# Patient Record
Sex: Female | Born: 1935 | Race: White | Hispanic: No | Marital: Married | Smoking: Never smoker
Health system: Southern US, Community
[De-identification: ages and names within clinical notes are randomized; demographics above are authoritative.]

## PROBLEM LIST (undated history)

## (undated) DIAGNOSIS — Z789 Other specified health status: Secondary | ICD-10-CM

## (undated) HISTORY — PX: BREAST CYST EXCISION: SHX579

---

## 2003-04-19 ENCOUNTER — Ambulatory Visit (HOSPITAL_COMMUNITY): Admission: RE | Admit: 2003-04-19 | Discharge: 2003-04-19 | Payer: Self-pay | Admitting: Internal Medicine

## 2003-04-19 ENCOUNTER — Encounter: Payer: Self-pay | Admitting: Internal Medicine

## 2003-08-14 ENCOUNTER — Ambulatory Visit (HOSPITAL_COMMUNITY): Admission: RE | Admit: 2003-08-14 | Discharge: 2003-08-14 | Payer: Self-pay | Admitting: Internal Medicine

## 2004-07-22 ENCOUNTER — Ambulatory Visit (HOSPITAL_COMMUNITY): Admission: RE | Admit: 2004-07-22 | Discharge: 2004-07-22 | Payer: Self-pay | Admitting: Internal Medicine

## 2005-04-17 ENCOUNTER — Ambulatory Visit (HOSPITAL_COMMUNITY): Admission: RE | Admit: 2005-04-17 | Discharge: 2005-04-17 | Payer: Self-pay | Admitting: Internal Medicine

## 2006-05-05 ENCOUNTER — Ambulatory Visit (HOSPITAL_COMMUNITY): Admission: RE | Admit: 2006-05-05 | Discharge: 2006-05-05 | Payer: Self-pay | Admitting: Internal Medicine

## 2006-08-17 ENCOUNTER — Ambulatory Visit (HOSPITAL_COMMUNITY): Admission: RE | Admit: 2006-08-17 | Discharge: 2006-08-17 | Payer: Self-pay | Admitting: Internal Medicine

## 2007-04-13 ENCOUNTER — Ambulatory Visit (HOSPITAL_COMMUNITY): Admission: RE | Admit: 2007-04-13 | Discharge: 2007-04-13 | Payer: Self-pay | Admitting: Internal Medicine

## 2009-01-02 ENCOUNTER — Ambulatory Visit (HOSPITAL_COMMUNITY): Admission: RE | Admit: 2009-01-02 | Discharge: 2009-01-02 | Payer: Self-pay | Admitting: Internal Medicine

## 2010-01-15 ENCOUNTER — Ambulatory Visit (HOSPITAL_COMMUNITY): Admission: RE | Admit: 2010-01-15 | Discharge: 2010-01-15 | Payer: Self-pay | Admitting: Internal Medicine

## 2010-01-18 ENCOUNTER — Ambulatory Visit (HOSPITAL_COMMUNITY): Admission: RE | Admit: 2010-01-18 | Discharge: 2010-01-18 | Payer: Self-pay | Admitting: Internal Medicine

## 2011-06-11 ENCOUNTER — Ambulatory Visit (HOSPITAL_COMMUNITY)
Admission: RE | Admit: 2011-06-11 | Discharge: 2011-06-11 | Disposition: A | Discharge status: Home / Self Care | Payer: Medicare HMO | Source: Ambulatory Visit | Attending: Internal Medicine | Admitting: Internal Medicine

## 2011-06-11 ENCOUNTER — Other Ambulatory Visit (HOSPITAL_COMMUNITY): Payer: Self-pay | Admitting: Internal Medicine

## 2011-06-11 DIAGNOSIS — R05 Cough: Secondary | ICD-10-CM

## 2011-06-11 DIAGNOSIS — R059 Cough, unspecified: Secondary | ICD-10-CM | POA: Insufficient documentation

## 2011-07-01 ENCOUNTER — Ambulatory Visit (HOSPITAL_COMMUNITY)
Admission: RE | Admit: 2011-07-01 | Discharge: 2011-07-01 | Disposition: A | Discharge status: Home / Self Care | Payer: Medicare HMO | Source: Ambulatory Visit | Attending: Internal Medicine | Admitting: Internal Medicine

## 2011-07-01 ENCOUNTER — Other Ambulatory Visit (HOSPITAL_COMMUNITY): Payer: Self-pay | Admitting: Internal Medicine

## 2011-07-01 DIAGNOSIS — R05 Cough: Secondary | ICD-10-CM

## 2011-07-01 DIAGNOSIS — R079 Chest pain, unspecified: Secondary | ICD-10-CM | POA: Insufficient documentation

## 2011-07-01 DIAGNOSIS — R059 Cough, unspecified: Secondary | ICD-10-CM

## 2014-04-18 ENCOUNTER — Encounter (HOSPITAL_COMMUNITY): Payer: Self-pay | Admitting: Pharmacy Technician

## 2014-04-26 ENCOUNTER — Encounter (HOSPITAL_COMMUNITY)
Admission: RE | Admit: 2014-04-26 | Discharge: 2014-04-26 | Disposition: A | Discharge status: Home / Self Care | Payer: Medicare HMO | Source: Ambulatory Visit | Attending: Ophthalmology | Admitting: Ophthalmology

## 2014-04-26 ENCOUNTER — Encounter (HOSPITAL_COMMUNITY): Payer: Self-pay

## 2014-04-26 DIAGNOSIS — Z01812 Encounter for preprocedural laboratory examination: Secondary | ICD-10-CM | POA: Diagnosis present

## 2014-04-26 DIAGNOSIS — H2510 Age-related nuclear cataract, unspecified eye: Secondary | ICD-10-CM | POA: Diagnosis not present

## 2014-04-26 DIAGNOSIS — Z01810 Encounter for preprocedural cardiovascular examination: Secondary | ICD-10-CM | POA: Diagnosis present

## 2014-04-26 LAB — BASIC METABOLIC PANEL
Anion gap: 11 (ref 5–15)
BUN: 14 mg/dL (ref 6–23)
CO2: 27 mEq/L (ref 19–32)
Calcium: 9.6 mg/dL (ref 8.4–10.5)
Chloride: 106 mEq/L (ref 96–112)
Creatinine, Ser: 0.87 mg/dL (ref 0.50–1.10)
GFR calc Af Amer: 70 mL/min — ABNORMAL LOW (ref >90)
GFR calc non Af Amer: 60 mL/min — ABNORMAL LOW (ref >90)
Glucose, Bld: 91 mg/dL (ref 70–99)
Potassium: 5 mEq/L (ref 3.7–5.3)
Sodium: 144 mEq/L (ref 137–147)

## 2014-04-26 LAB — HEMOGLOBIN AND HEMATOCRIT, BLOOD
HCT: 43.2 % (ref 36.0–46.0)
Hemoglobin: 14.3 g/dL (ref 12.0–15.0)

## 2014-04-26 NOTE — Pre-Procedure Instructions (Signed)
Patient given information to sign up for my chart at home. 

## 2014-04-26 NOTE — Patient Instructions (Signed)
Your procedure is scheduled on: 05/02/2014  Report to [facility] at  41  AM.  Call this number if you have problems the morning of surgery: [phone]   Do not eat food or drink liquids :After Midnight.      Take these medicines the morning of surgery with A SIP OF WATER: none   Do not wear jewelry, make-up or nail polish.  Do not wear lotions, powders, or perfumes.   Do not shave 48 hours prior to surgery.  Do not bring valuables to the hospital.  Contacts, dentures or bridgework may not be worn into surgery.  Leave suitcase in the car. After surgery it may be brought to your room.  For patients admitted to the hospital, checkout time is 11:00 AM the day of discharge.   Patients discharged the day of surgery will not be allowed to drive home.  :     Please read over the following fact sheets that you were given: Coughing and Deep Breathing, Surgical Site Infection Prevention, Anesthesia Post-op Instructions and Care and Recovery After Surgery    Cataract A cataract is a clouding of the lens of the eye. When a lens becomes cloudy, vision is reduced based on the degree and nature of the clouding. Many cataracts reduce vision to some degree. Some cataracts make people more near-sighted as they develop. Other cataracts increase glare. Cataracts that are ignored and become worse can sometimes look white. The white color can be seen through the pupil. CAUSES   Aging. However, cataracts may occur at any age, even in newborns.   Certain drugs.   Trauma to the eye.   Certain diseases such as diabetes.   Specific eye diseases such as chronic inflammation inside the eye or a sudden attack of a rare form of glaucoma.   Inherited or acquired medical problems.  SYMPTOMS   Gradual, progressive drop in vision in the affected eye.   Severe, rapid visual loss. This most often happens when trauma is the cause.  DIAGNOSIS  To detect a cataract, an eye doctor examines the lens. Cataracts are  best diagnosed with an exam of the eyes with the pupils enlarged (dilated) by drops.  TREATMENT  For an early cataract, vision may improve by using different eyeglasses or stronger lighting. If that does not help your vision, surgery is the only effective treatment. A cataract needs to be surgically removed when vision loss interferes with your everyday activities, such as driving, reading, or watching TV. A cataract may also have to be removed if it prevents examination or treatment of another eye problem. Surgery removes the cloudy lens and usually replaces it with a substitute lens (intraocular lens, IOL).  At a time when both you and your doctor agree, the cataract will be surgically removed. If you have cataracts in both eyes, only one is usually removed at a time. This allows the operated eye to heal and be out of danger from any possible problems after surgery (such as infection or poor wound healing). In rare cases, a cataract may be doing damage to your eye. In these cases, your caregiver may advise surgical removal right away. The vast majority of people who have cataract surgery have better vision afterward. HOME CARE INSTRUCTIONS  If you are not planning surgery, you may be asked to do the following:  Use different eyeglasses.   Use stronger or brighter lighting.   Ask your eye doctor about reducing your medicine dose or changing medicines if  it is thought that a medicine caused your cataract. Changing medicines does not make the cataract go away on its own.   Become familiar with your surroundings. Poor vision can lead to injury. Avoid bumping into things on the affected side. You are at a higher risk for tripping or falling.   Exercise extreme care when driving or operating machinery.   Wear sunglasses if you are sensitive to bright light or experiencing problems with glare.  SEEK IMMEDIATE MEDICAL CARE IF:   You have a worsening or sudden vision loss.   You notice redness,  swelling, or increasing pain in the eye.   You have a fever.  Document Released: 07/28/2005 Document Revised: 07/17/2011 Document Reviewed: 03/21/2011 [facility] Patient Information 2012 [place].PATIENT INSTRUCTIONS POST-ANESTHESIA  IMMEDIATELY FOLLOWING SURGERY:  Do not drive or operate machinery for the first twenty four hours after surgery.  Do not make any important decisions for twenty four hours after surgery or while taking narcotic pain medications or sedatives.  If you develop intractable nausea and vomiting or a severe headache please notify your doctor immediately.  FOLLOW-UP:  Please make an appointment with your surgeon as instructed. You do not need to follow up with anesthesia unless specifically instructed to do so.  WOUND CARE INSTRUCTIONS (if applicable):  Keep a dry clean dressing on the anesthesia/puncture wound site if there is drainage.  Once the wound has quit draining you may leave it open to air.  Generally you should leave the bandage intact for twenty four hours unless there is drainage.  If the epidural site drains for more than 36-48 hours please call the anesthesia department.  QUESTIONS?:  Please feel free to call your physician or the hospital operator if you have any questions, and they will be happy to assist you.

## 2014-05-01 MED ORDER — TETRACAINE HCL 0.5 % OP SOLN
OPHTHALMIC | Status: AC
  Filled 2014-05-01: qty 2

## 2014-05-01 MED ORDER — KETOROLAC TROMETHAMINE 0.5 % OP SOLN
OPHTHALMIC | Status: AC
  Filled 2014-05-01: qty 5

## 2014-05-01 MED ORDER — CYCLOPENTOLATE-PHENYLEPHRINE OP SOLN OPTIME - NO CHARGE
OPHTHALMIC | Status: AC
  Filled 2014-05-01: qty 2

## 2014-05-01 MED ORDER — PHENYLEPHRINE HCL 2.5 % OP SOLN
OPHTHALMIC | Status: AC
  Filled 2014-05-01: qty 15

## 2014-05-02 ENCOUNTER — Encounter (HOSPITAL_COMMUNITY)
Admission: RE | Disposition: A | Discharge status: Home / Self Care | Payer: Self-pay | Source: Ambulatory Visit | Attending: Ophthalmology

## 2014-05-02 ENCOUNTER — Encounter (HOSPITAL_COMMUNITY): Payer: Self-pay | Admitting: *Deleted

## 2014-05-02 ENCOUNTER — Ambulatory Visit (HOSPITAL_COMMUNITY)
Admission: RE | Admit: 2014-05-02 | Discharge: 2014-05-02 | Disposition: A | Discharge status: Home / Self Care | Payer: Medicare HMO | Source: Ambulatory Visit | Attending: Ophthalmology | Admitting: Ophthalmology

## 2014-05-02 ENCOUNTER — Ambulatory Visit (HOSPITAL_COMMUNITY): Payer: Medicare HMO | Admitting: Anesthesiology

## 2014-05-02 ENCOUNTER — Encounter (HOSPITAL_COMMUNITY): Payer: Medicare HMO | Admitting: Anesthesiology

## 2014-05-02 DIAGNOSIS — H2510 Age-related nuclear cataract, unspecified eye: Secondary | ICD-10-CM | POA: Insufficient documentation

## 2014-05-02 HISTORY — PX: CATARACT EXTRACTION W/PHACO: SHX586

## 2014-05-02 SURGERY — PHACOEMULSIFICATION, CATARACT, WITH IOL INSERTION
Anesthesia: Monitor Anesthesia Care | Site: Eye | Laterality: Right

## 2014-05-02 MED ORDER — BSS IO SOLN
INTRAOCULAR | Status: DC | PRN
  Administered 2014-05-02: 15 mL via INTRAOCULAR

## 2014-05-02 MED ORDER — PROVISC 10 MG/ML IO SOLN
INTRAOCULAR | Status: DC | PRN
  Administered 2014-05-02: 0.85 mL via INTRAOCULAR

## 2014-05-02 MED ORDER — KETOROLAC TROMETHAMINE 0.5 % OP SOLN
1.0000 [drp] | OPHTHALMIC | Status: AC
Start: 2014-05-02 — End: 2014-05-02
  Administered 2014-05-02 (×3): 1 [drp] via OPHTHALMIC

## 2014-05-02 MED ORDER — MIDAZOLAM HCL 2 MG/2ML IJ SOLN
INTRAMUSCULAR | Status: AC
  Filled 2014-05-02: qty 2

## 2014-05-02 MED ORDER — PHENYLEPHRINE HCL 2.5 % OP SOLN
1.0000 [drp] | OPHTHALMIC | Status: AC
  Administered 2014-05-02 (×3): 1 [drp] via OPHTHALMIC

## 2014-05-02 MED ORDER — MIDAZOLAM HCL 2 MG/2ML IJ SOLN
1.0000 mg | INTRAMUSCULAR | Status: DC | PRN
  Administered 2014-05-02: 2 mg via INTRAVENOUS

## 2014-05-02 MED ORDER — FENTANYL CITRATE 0.05 MG/ML IJ SOLN
INTRAMUSCULAR | Status: AC
  Filled 2014-05-02: qty 2

## 2014-05-02 MED ORDER — LACTATED RINGERS IV SOLN
INTRAVENOUS | Status: DC
  Administered 2014-05-02: 09:00:00 via INTRAVENOUS

## 2014-05-02 MED ORDER — TETRACAINE HCL 0.5 % OP SOLN
1.0000 [drp] | OPHTHALMIC | Status: AC
  Administered 2014-05-02 (×3): 1 [drp] via OPHTHALMIC

## 2014-05-02 MED ORDER — TETRACAINE 0.5 % OP SOLN OPTIME - NO CHARGE
OPHTHALMIC | Status: DC | PRN
  Administered 2014-05-02: 2 [drp] via OPHTHALMIC

## 2014-05-02 MED ORDER — BSS IO SOLN
INTRAOCULAR | Status: DC | PRN
  Administered 2014-05-02: 09:00:00

## 2014-05-02 MED ORDER — FENTANYL CITRATE 0.05 MG/ML IJ SOLN
25.0000 ug | INTRAMUSCULAR | Status: AC
  Administered 2014-05-02: 25 ug via INTRAVENOUS

## 2014-05-02 MED ORDER — EPINEPHRINE HCL 1 MG/ML IJ SOLN
INTRAMUSCULAR | Status: AC
  Filled 2014-05-02: qty 1

## 2014-05-02 MED ORDER — CYCLOPENTOLATE-PHENYLEPHRINE 0.2-1 % OP SOLN
1.0000 [drp] | OPHTHALMIC | Status: AC
  Administered 2014-05-02 (×3): 1 [drp] via OPHTHALMIC

## 2014-05-02 SURGICAL SUPPLY — 24 items
CAPSULAR TENSION RING-AMO (OPHTHALMIC RELATED) IMPLANT
CLOTH BEACON ORANGE TIMEOUT ST (SAFETY) ×2 IMPLANT
EYE SHIELD UNIVERSAL CLEAR (GAUZE/BANDAGES/DRESSINGS) ×2 IMPLANT
GLOVE BIO SURGEON STRL SZ 6.5 (GLOVE) ×2 IMPLANT
GLOVE ECLIPSE 6.5 STRL STRAW (GLOVE) IMPLANT
GLOVE ECLIPSE 7.0 STRL STRAW (GLOVE) IMPLANT
GLOVE EXAM NITRILE LRG STRL (GLOVE) IMPLANT
GLOVE EXAM NITRILE MD LF STRL (GLOVE) ×2 IMPLANT
GLOVE SKINSENSE NS SZ6.5 (GLOVE)
GLOVE SKINSENSE STRL SZ6.5 (GLOVE) IMPLANT
HEALON 5 0.6 ML (INTRAOCULAR LENS) IMPLANT
KIT VITRECTOMY (OPHTHALMIC RELATED) IMPLANT
PAD ARMBOARD 7.5X6 YLW CONV (MISCELLANEOUS) ×2 IMPLANT
PROC W NO LENS (INTRAOCULAR LENS)
PROC W SPEC LENS (INTRAOCULAR LENS)
PROCESS W NO LENS (INTRAOCULAR LENS) IMPLANT
PROCESS W SPEC LENS (INTRAOCULAR LENS) IMPLANT
RETRACTOR IRIS SIGHTPATH (OPHTHALMIC RELATED) IMPLANT
RING MALYGIN (MISCELLANEOUS) IMPLANT
SIGHTPATH CAT PROC W REG LENS (Ophthalmic Related) ×2 IMPLANT
TAPE SURG TRANSPORE 1 IN (GAUZE/BANDAGES/DRESSINGS) ×1 IMPLANT
TAPE SURGICAL TRANSPORE 1 IN (GAUZE/BANDAGES/DRESSINGS) ×1
VISCOELASTIC ADDITIONAL (OPHTHALMIC RELATED) IMPLANT
WATER STERILE IRR 250ML POUR (IV SOLUTION) ×2 IMPLANT

## 2014-05-02 NOTE — Discharge Instructions (Signed)
Christine Conner  05/02/2014           [facility] Instructions [place] [address]      1. Avoid closing eyes tightly. One often closes the eye tightly when laughing, talking, sneezing, coughing or if they feel irritated. At these times, you should be careful not to close your eyes tightly.  2. Instill eye drops as instructed. To instill drops in your eye, open it, look up and have someone gently pull the lower lid down and instill a couple of drops inside the lower lid.  3. Do not touch upper lid.  4. Take Advil or Tylenol for pain.  5. You may use either eye for near work, such as reading or sewing and you may watch television.  6. You may have your hair done at the beauty parlor at any time.  7. Wear dark glasses with or without your own glasses if you are in bright light.  8. Call our office at [phone] or [phone] if you have sharp pain in your eye or unusual symptoms.  9. Do not be concerned because vision in the operative eye is not good. It will not be good, no matter how successful the operation, until you get a special lens for it. Your old glasses will not be suited to the new eye that was operated on and you will not be ready for a new lens for about a month.  10. Follow up at the [facility] office.    I have received a copy of the above instructions and will follow them.

## 2014-05-02 NOTE — Anesthesia Preprocedure Evaluation (Signed)
Anesthesia Evaluation  Patient identified by MRN, date of birth, ID band Patient awake    Reviewed: Allergy & Precautions, H&P , NPO status , Patient's Chart, lab work & pertinent test results  Airway Mallampati: II TM Distance: <3 FB     Dental  (+) Teeth Intact   Pulmonary neg pulmonary ROS,  breath sounds clear to auscultation        Cardiovascular negative cardio ROS  Rhythm:Regular Rate:Normal     Neuro/Psych    GI/Hepatic negative GI ROS,   Endo/Other    Renal/GU      Musculoskeletal   Abdominal   Peds  Hematology   Anesthesia Other Findings   Reproductive/Obstetrics                           Anesthesia Physical Anesthesia Plan  ASA: I  Anesthesia Plan: MAC   Post-op Pain Management:    Induction: Intravenous  Airway Management Planned: Nasal Cannula  Additional Equipment:   Intra-op Plan:   Post-operative Plan:   Informed Consent: I have reviewed the patients History and Physical, chart, labs and discussed the procedure including the risks, benefits and alternatives for the proposed anesthesia with the patient or authorized representative who has indicated his/her understanding and acceptance.     Plan Discussed with:   Anesthesia Plan Comments:         Anesthesia Quick Evaluation

## 2014-05-02 NOTE — H&P (Signed)
The patient was re examined and there is no change in the patients condition since the original H and P. 

## 2014-05-02 NOTE — Anesthesia Postprocedure Evaluation (Signed)
  Anesthesia Post-op Note  Patient: Christine Conner  Procedure(s) Performed: Procedure(s) with comments: CATARACT EXTRACTION PHACO AND INTRAOCULAR LENS PLACEMENT (IOC) (Right) - CDE:8.79  Patient Location: Short Stay  Anesthesia Type:MAC  Level of Consciousness: awake, alert  and oriented  Airway and Oxygen Therapy: Patient Spontanous Breathing  Post-op Pain: none  Post-op Assessment: Post-op Vital signs reviewed, Patient's Cardiovascular Status Stable, Respiratory Function Stable, Patent Airway and No signs of Nausea or vomiting  Post-op Vital Signs: Reviewed and stable  Last Vitals:  Filed Vitals:   05/02/14 0835  BP: 128/81  Pulse:   Temp:   Resp: 22    Complications: No apparent anesthesia complications

## 2014-05-02 NOTE — Op Note (Signed)
Patient brought to the operating room and prepped and draped in the usual manner.  Lid speculum inserted in right eye.  Stab incision made at the twelve o'clock position.  Provisc instilled in the anterior chamber.   A 2.4 mm. Stab incision was made temporally.  An anterior capsulotomy was done with a bent 25 gauge needle.  The nucleus was hydrodissected.  The Phaco tip was inserted in the anterior chamber and the nucleus was emulsified.  CDE was 8.79.  The cortical material was then removed with the I and A tip.  Posterior capsule was the polished.  The anterior chamber was deepened with Provisc.  A 23.0 Diopter Rayner 570C IOL was then inserted in the capsular bag.  Provisc was then removed with the I and A tip.  The wound was then hydrated.  Patient sent to the Recovery Room in good condition with follow up in my office.  Preoperative Diagnosis:  Nuclear Cataract OD Postoperative Diagnosis:  Same Procedure name: Kelman Phacoemulsification OD with IOL

## 2014-05-02 NOTE — Transfer of Care (Signed)
Immediate Anesthesia Transfer of Care Note  Patient: Christine Conner  Procedure(s) Performed: Procedure(s) with comments: CATARACT EXTRACTION PHACO AND INTRAOCULAR LENS PLACEMENT (IOC) (Right) - CDE:8.79  Patient Location: Short Stay  Anesthesia Type:MAC  Level of Consciousness: awake  Airway & Oxygen Therapy: Patient Spontanous Breathing  Post-op Assessment: Report given to PACU RN  Post vital signs: Reviewed  Complications: No apparent anesthesia complications

## 2014-05-03 ENCOUNTER — Encounter (HOSPITAL_COMMUNITY): Payer: Self-pay | Admitting: Ophthalmology

## 2014-05-04 ENCOUNTER — Encounter (HOSPITAL_COMMUNITY): Payer: Self-pay | Admitting: Pharmacy Technician

## 2014-05-17 ENCOUNTER — Encounter (HOSPITAL_COMMUNITY)
Admission: RE | Admit: 2014-05-17 | Discharge: 2014-05-17 | Disposition: A | Discharge status: Home / Self Care | Payer: Medicare HMO | Source: Ambulatory Visit | Attending: Ophthalmology | Admitting: Ophthalmology

## 2014-05-22 MED ORDER — CYCLOPENTOLATE-PHENYLEPHRINE OP SOLN OPTIME - NO CHARGE
OPHTHALMIC | Status: AC
  Filled 2014-05-22: qty 2

## 2014-05-22 MED ORDER — ONDANSETRON HCL 4 MG/2ML IJ SOLN: 4.0000 mg | Freq: Once | INTRAMUSCULAR | Status: AC | PRN

## 2014-05-22 MED ORDER — KETOROLAC TROMETHAMINE 0.5 % OP SOLN
OPHTHALMIC | Status: AC
  Filled 2014-05-22: qty 5

## 2014-05-22 MED ORDER — PHENYLEPHRINE HCL 2.5 % OP SOLN
OPHTHALMIC | Status: AC
  Filled 2014-05-22: qty 15

## 2014-05-22 MED ORDER — TETRACAINE HCL 0.5 % OP SOLN
OPHTHALMIC | Status: AC
  Filled 2014-05-22: qty 2

## 2014-05-22 MED ORDER — FENTANYL CITRATE 0.05 MG/ML IJ SOLN: 25.0000 ug | INTRAMUSCULAR | Status: DC | PRN

## 2014-05-23 ENCOUNTER — Encounter (HOSPITAL_COMMUNITY): Payer: Medicare HMO | Admitting: Anesthesiology

## 2014-05-23 ENCOUNTER — Ambulatory Visit (HOSPITAL_COMMUNITY)
Admission: RE | Admit: 2014-05-23 | Discharge: 2014-05-23 | Disposition: A | Discharge status: Home / Self Care | Payer: Medicare HMO | Source: Ambulatory Visit | Attending: Ophthalmology | Admitting: Ophthalmology

## 2014-05-23 ENCOUNTER — Ambulatory Visit (HOSPITAL_COMMUNITY): Payer: Medicare HMO | Admitting: Anesthesiology

## 2014-05-23 ENCOUNTER — Encounter (HOSPITAL_COMMUNITY)
Admission: RE | Disposition: A | Discharge status: Home / Self Care | Payer: Self-pay | Source: Ambulatory Visit | Attending: Ophthalmology

## 2014-05-23 ENCOUNTER — Encounter (HOSPITAL_COMMUNITY): Payer: Self-pay | Admitting: *Deleted

## 2014-05-23 DIAGNOSIS — H2512 Age-related nuclear cataract, left eye: Secondary | ICD-10-CM | POA: Diagnosis present

## 2014-05-23 HISTORY — DX: Other specified health status: Z78.9

## 2014-05-23 HISTORY — PX: CATARACT EXTRACTION W/PHACO: SHX586

## 2014-05-23 SURGERY — PHACOEMULSIFICATION, CATARACT, WITH IOL INSERTION
Anesthesia: Monitor Anesthesia Care | Laterality: Left

## 2014-05-23 MED ORDER — CYCLOPENTOLATE-PHENYLEPHRINE 0.2-1 % OP SOLN
1.0000 [drp] | OPHTHALMIC | Status: AC
  Administered 2014-05-23 (×3): 1 [drp] via OPHTHALMIC

## 2014-05-23 MED ORDER — EPINEPHRINE HCL 1 MG/ML IJ SOLN
INTRAOCULAR | Status: DC | PRN
  Administered 2014-05-23: 10:00:00

## 2014-05-23 MED ORDER — PROVISC 10 MG/ML IO SOLN
INTRAOCULAR | Status: DC | PRN
  Administered 2014-05-23: 0.85 mL via INTRAOCULAR

## 2014-05-23 MED ORDER — LACTATED RINGERS IV SOLN
INTRAVENOUS | Status: DC
  Administered 2014-05-23: 09:00:00 via INTRAVENOUS

## 2014-05-23 MED ORDER — FENTANYL CITRATE 0.05 MG/ML IJ SOLN
25.0000 ug | INTRAMUSCULAR | Status: AC
  Administered 2014-05-23 (×2): 25 ug via INTRAVENOUS

## 2014-05-23 MED ORDER — EPINEPHRINE HCL 1 MG/ML IJ SOLN
INTRAMUSCULAR | Status: AC
  Filled 2014-05-23: qty 1

## 2014-05-23 MED ORDER — KETOROLAC TROMETHAMINE 0.5 % OP SOLN
1.0000 [drp] | OPHTHALMIC | Status: AC
  Administered 2014-05-23 (×3): 1 [drp] via OPHTHALMIC

## 2014-05-23 MED ORDER — MIDAZOLAM HCL 2 MG/2ML IJ SOLN
1.0000 mg | INTRAMUSCULAR | Status: DC | PRN
  Administered 2014-05-23: 2 mg via INTRAVENOUS
  Filled 2014-05-23: qty 2

## 2014-05-23 MED ORDER — MIDAZOLAM HCL 2 MG/2ML IJ SOLN
INTRAMUSCULAR | Status: AC
  Filled 2014-05-23: qty 2

## 2014-05-23 MED ORDER — BSS IO SOLN
INTRAOCULAR | Status: DC | PRN
  Administered 2014-05-23: 15 mL

## 2014-05-23 MED ORDER — FENTANYL CITRATE 0.05 MG/ML IJ SOLN
INTRAMUSCULAR | Status: AC
  Filled 2014-05-23: qty 2

## 2014-05-23 MED ORDER — TETRACAINE HCL 0.5 % OP SOLN
1.0000 [drp] | OPHTHALMIC | Status: AC
  Administered 2014-05-23 (×3): 1 [drp] via OPHTHALMIC

## 2014-05-23 MED ORDER — PHENYLEPHRINE HCL 2.5 % OP SOLN
1.0000 [drp] | OPHTHALMIC | Status: AC
  Administered 2014-05-23 (×3): 1 [drp] via OPHTHALMIC

## 2014-05-23 MED ORDER — POVIDONE-IODINE 5 % OP SOLN
OPHTHALMIC | Status: DC | PRN
  Administered 2014-05-23: 1 via OPHTHALMIC

## 2014-05-23 SURGICAL SUPPLY — 25 items
CAPSULAR TENSION RING-AMO (OPHTHALMIC RELATED) IMPLANT
CLOTH BEACON ORANGE TIMEOUT ST (SAFETY) ×2 IMPLANT
EYE SHIELD UNIVERSAL CLEAR (GAUZE/BANDAGES/DRESSINGS) ×2 IMPLANT
GLOVE BIO SURGEON STRL SZ 6.5 (GLOVE) IMPLANT
GLOVE ECLIPSE 6.5 STRL STRAW (GLOVE) IMPLANT
GLOVE ECLIPSE 7.0 STRL STRAW (GLOVE) IMPLANT
GLOVE EXAM NITRILE LRG STRL (GLOVE) IMPLANT
GLOVE EXAM NITRILE MD LF STRL (GLOVE) ×2 IMPLANT
GLOVE INDICATOR 7.0 STRL GRN (GLOVE) ×4 IMPLANT
GLOVE SKINSENSE NS SZ6.5 (GLOVE)
GLOVE SKINSENSE STRL SZ6.5 (GLOVE) IMPLANT
HEALON 5 0.6 ML (INTRAOCULAR LENS) IMPLANT
KIT VITRECTOMY (OPHTHALMIC RELATED) IMPLANT
PAD ARMBOARD 7.5X6 YLW CONV (MISCELLANEOUS) ×2 IMPLANT
PROC W NO LENS (INTRAOCULAR LENS)
PROC W SPEC LENS (INTRAOCULAR LENS)
PROCESS W NO LENS (INTRAOCULAR LENS) IMPLANT
PROCESS W SPEC LENS (INTRAOCULAR LENS) IMPLANT
RETRACTOR IRIS SIGHTPATH (OPHTHALMIC RELATED) IMPLANT
RING MALYGIN (MISCELLANEOUS) IMPLANT
SIGHTPATH CAT PROC W REG LENS (Ophthalmic Related) ×2 IMPLANT
TAPE SURG TRANSPORE 1 IN (GAUZE/BANDAGES/DRESSINGS) ×1 IMPLANT
TAPE SURGICAL TRANSPORE 1 IN (GAUZE/BANDAGES/DRESSINGS) ×1
VISCOELASTIC ADDITIONAL (OPHTHALMIC RELATED) IMPLANT
WATER STERILE IRR 250ML POUR (IV SOLUTION) ×2 IMPLANT

## 2014-05-23 NOTE — Anesthesia Preprocedure Evaluation (Signed)
Anesthesia Evaluation  Patient identified by MRN, date of birth, ID band Patient awake    Reviewed: Allergy & Precautions, H&P , NPO status , Patient's Chart, lab work & pertinent test results  Airway Mallampati: II TM Distance: <3 FB     Dental  (+) Teeth Intact   Pulmonary neg pulmonary ROS,  breath sounds clear to auscultation        Cardiovascular negative cardio ROS  Rhythm:Regular Rate:Normal     Neuro/Psych    GI/Hepatic negative GI ROS,   Endo/Other    Renal/GU      Musculoskeletal   Abdominal   Peds  Hematology   Anesthesia Other Findings   Reproductive/Obstetrics                           Anesthesia Physical Anesthesia Plan  ASA: I  Anesthesia Plan: MAC   Post-op Pain Management:    Induction: Intravenous  Airway Management Planned: Nasal Cannula  Additional Equipment:   Intra-op Plan:   Post-operative Plan:   Informed Consent: I have reviewed the patients History and Physical, chart, labs and discussed the procedure including the risks, benefits and alternatives for the proposed anesthesia with the patient or authorized representative who has indicated his/her understanding and acceptance.     Plan Discussed with:   Anesthesia Plan Comments:         Anesthesia Quick Evaluation

## 2014-05-23 NOTE — Transfer of Care (Signed)
Immediate Anesthesia Transfer of Care Note  Patient: Christine Conner  Procedure(s) Performed: Procedure(s) with comments: CATARACT EXTRACTION PHACO AND INTRAOCULAR LENS PLACEMENT (IOC) (Left) - CDE 9.01  Patient Location: PACU  Anesthesia Type:MAC  Level of Consciousness: awake, alert , oriented and patient cooperative  Airway & Oxygen Therapy: Patient Spontanous Breathing  Post-op Assessment: Report given to PACU RN, Post -op Vital signs reviewed and stable and Patient moving all extremities  Post vital signs: Reviewed and stable  Complications: No apparent anesthesia complications

## 2014-05-23 NOTE — Anesthesia Postprocedure Evaluation (Signed)
  Anesthesia Post-op Note  Patient: Christine Conner  Procedure(s) Performed: Procedure(s) with comments: CATARACT EXTRACTION PHACO AND INTRAOCULAR LENS PLACEMENT (IOC) (Left) - CDE 9.01  Patient Location: Short Stay  Anesthesia Type:MAC  Level of Consciousness: awake, alert , oriented and patient cooperative  Airway and Oxygen Therapy: Patient Spontanous Breathing  Post-op Pain: none  Post-op Assessment: Post-op Vital signs reviewed, Patient's Cardiovascular Status Stable, Respiratory Function Stable, Patent Airway and Pain level controlled  Post-op Vital Signs: Reviewed and stable  Last Vitals:  Filed Vitals:   05/23/14 0925  BP: 108/68  Pulse:   Temp:   Resp: 20    Complications: No apparent anesthesia complications

## 2014-05-23 NOTE — H&P (Signed)
The patient was re examined and there is no change in the patients condition since the original H and P. 

## 2014-05-23 NOTE — Discharge Instructions (Signed)
[name]  05/23/2014           [facility] Instructions [place] [address]      1. Avoid closing eyes tightly. One often closes the eye tightly when laughing, talking, sneezing, coughing or if they feel irritated. At these times, you should be careful not to close your eyes tightly.  2. Instill eye drops as instructed. To instill drops in your eye, open it, look up and have someone gently pull the lower lid down and instill a couple of drops inside the lower lid.  3. Do not touch upper lid.  4. Take Advil or Tylenol for pain.  5. You may use either eye for near work, such as reading or sewing and you may watch television.  6. You may have your hair done at the beauty parlor at any time.  7. Wear dark glasses with or without your own glasses if you are in bright light.  8. Call our office at [phone] or [phone] if you have sharp pain in your eye or unusual symptoms.  9. Do not be concerned because vision in the operative eye is not good. It will not be good, no matter how successful the operation, until you get a special lens for it. Your old glasses will not be suited to the new eye that was operated on and you will not be ready for a new lens for about a month.  10. Follow up at the [facility] office.    I have received a copy of the above instructions and will follow them.

## 2014-05-23 NOTE — Op Note (Signed)
Patient brought to the operating room and prepped and draped in the usual manner.  Lid speculum inserted in left eye.  Stab incision made at the twelve o'clock position.  Provisc instilled in the anterior chamber.   A 2.4 mm. Stab incision was made temporally.  An anterior capsulotomy was done with a bent 25 gauge needle.  The nucleus was hydrodissected.  The Phaco tip was inserted in the anterior chamber and the nucleus was emulsified.  CDE was 9.01.  The cortical material was then removed with the I and A tip.  Posterior capsule was the polished.  The anterior chamber was deepened with Provisc.  A 22.5 Diopter Rayner 570C IOL was then inserted in the capsular bag.  Provisc was then removed with the I and A tip.  The wound was then hydrated.  Patient sent to the Recovery Room in good condition with follow up in my office.  Preoperative Diagnosis:  Nuclear Cataract OS Postoperative Diagnosis:  Same Procedure name: Kelman Phacoemulsification OS with IOL

## 2014-05-24 ENCOUNTER — Encounter (HOSPITAL_COMMUNITY): Payer: Self-pay | Admitting: Ophthalmology

## 2015-01-04 ENCOUNTER — Ambulatory Visit (HOSPITAL_COMMUNITY)
Admission: RE | Admit: 2015-01-04 | Discharge: 2015-01-04 | Disposition: A | Discharge status: Home / Self Care | Payer: Medicare HMO | Source: Ambulatory Visit | Attending: Internal Medicine | Admitting: Internal Medicine

## 2015-01-04 ENCOUNTER — Other Ambulatory Visit (HOSPITAL_COMMUNITY): Payer: Self-pay | Admitting: Internal Medicine

## 2015-01-04 DIAGNOSIS — R0789 Other chest pain: Secondary | ICD-10-CM

## 2015-04-26 ENCOUNTER — Other Ambulatory Visit (HOSPITAL_COMMUNITY): Payer: Self-pay | Admitting: Internal Medicine

## 2015-04-26 DIAGNOSIS — R609 Edema, unspecified: Secondary | ICD-10-CM

## 2015-04-27 ENCOUNTER — Ambulatory Visit (HOSPITAL_COMMUNITY)
Admission: RE | Admit: 2015-04-27 | Discharge: 2015-04-27 | Disposition: A | Discharge status: Home / Self Care | Payer: Medicare HMO | Source: Ambulatory Visit | Attending: Internal Medicine | Admitting: Internal Medicine

## 2015-04-27 DIAGNOSIS — R609 Edema, unspecified: Secondary | ICD-10-CM

## 2015-04-27 DIAGNOSIS — M25552 Pain in left hip: Secondary | ICD-10-CM | POA: Diagnosis not present

## 2015-04-27 DIAGNOSIS — M7989 Other specified soft tissue disorders: Secondary | ICD-10-CM | POA: Diagnosis not present

## 2015-06-06 ENCOUNTER — Other Ambulatory Visit (HOSPITAL_COMMUNITY): Payer: Self-pay | Admitting: Internal Medicine

## 2015-06-06 ENCOUNTER — Ambulatory Visit (HOSPITAL_COMMUNITY)
Admission: RE | Admit: 2015-06-06 | Discharge: 2015-06-06 | Disposition: A | Discharge status: Home / Self Care | Payer: Medicare HMO | Source: Ambulatory Visit | Attending: Internal Medicine | Admitting: Internal Medicine

## 2015-06-06 DIAGNOSIS — M545 Low back pain: Secondary | ICD-10-CM | POA: Diagnosis present

## 2015-06-06 DIAGNOSIS — M1288 Other specific arthropathies, not elsewhere classified, other specified site: Secondary | ICD-10-CM | POA: Insufficient documentation

## 2015-06-06 DIAGNOSIS — M4316 Spondylolisthesis, lumbar region: Secondary | ICD-10-CM | POA: Insufficient documentation

## 2015-06-06 DIAGNOSIS — M5136 Other intervertebral disc degeneration, lumbar region: Secondary | ICD-10-CM | POA: Insufficient documentation

## 2015-07-20 DIAGNOSIS — E785 Hyperlipidemia, unspecified: Secondary | ICD-10-CM | POA: Diagnosis not present

## 2015-07-20 DIAGNOSIS — Z79899 Other long term (current) drug therapy: Secondary | ICD-10-CM | POA: Diagnosis not present

## 2015-07-20 DIAGNOSIS — M1990 Unspecified osteoarthritis, unspecified site: Secondary | ICD-10-CM | POA: Diagnosis not present

## 2015-07-20 DIAGNOSIS — R7301 Impaired fasting glucose: Secondary | ICD-10-CM | POA: Diagnosis not present

## 2015-07-27 DIAGNOSIS — Z0001 Encounter for general adult medical examination with abnormal findings: Secondary | ICD-10-CM | POA: Diagnosis not present

## 2015-07-27 DIAGNOSIS — M545 Low back pain: Secondary | ICD-10-CM | POA: Diagnosis not present

## 2015-07-27 DIAGNOSIS — Z23 Encounter for immunization: Secondary | ICD-10-CM | POA: Diagnosis not present

## 2015-07-27 DIAGNOSIS — M818 Other osteoporosis without current pathological fracture: Secondary | ICD-10-CM | POA: Diagnosis not present

## 2015-07-27 DIAGNOSIS — R7301 Impaired fasting glucose: Secondary | ICD-10-CM | POA: Diagnosis not present

## 2015-07-30 ENCOUNTER — Other Ambulatory Visit (HOSPITAL_COMMUNITY): Payer: Self-pay | Admitting: Internal Medicine

## 2015-07-30 DIAGNOSIS — Z780 Asymptomatic menopausal state: Secondary | ICD-10-CM

## 2015-08-14 ENCOUNTER — Ambulatory Visit (HOSPITAL_COMMUNITY)
Admission: RE | Admit: 2015-08-14 | Discharge: 2015-08-14 | Disposition: A | Discharge status: Home / Self Care | Payer: Medicare HMO | Source: Ambulatory Visit | Attending: Internal Medicine | Admitting: Internal Medicine

## 2015-08-14 DIAGNOSIS — M85851 Other specified disorders of bone density and structure, right thigh: Secondary | ICD-10-CM | POA: Insufficient documentation

## 2015-08-14 DIAGNOSIS — M8588 Other specified disorders of bone density and structure, other site: Secondary | ICD-10-CM | POA: Diagnosis not present

## 2015-08-14 DIAGNOSIS — C50919 Malignant neoplasm of unspecified site of unspecified female breast: Secondary | ICD-10-CM | POA: Diagnosis not present

## 2015-08-14 DIAGNOSIS — M8580 Other specified disorders of bone density and structure, unspecified site: Secondary | ICD-10-CM | POA: Diagnosis present

## 2015-08-14 DIAGNOSIS — R29890 Loss of height: Secondary | ICD-10-CM | POA: Insufficient documentation

## 2015-08-14 DIAGNOSIS — Z780 Asymptomatic menopausal state: Secondary | ICD-10-CM | POA: Diagnosis not present

## 2015-08-20 ENCOUNTER — Ambulatory Visit (HOSPITAL_COMMUNITY): Payer: Medicare HMO | Attending: Internal Medicine | Admitting: Physical Therapy

## 2015-08-20 DIAGNOSIS — M6281 Muscle weakness (generalized): Secondary | ICD-10-CM | POA: Diagnosis not present

## 2015-08-20 DIAGNOSIS — R293 Abnormal posture: Secondary | ICD-10-CM

## 2015-08-20 DIAGNOSIS — R262 Difficulty in walking, not elsewhere classified: Secondary | ICD-10-CM

## 2015-08-20 DIAGNOSIS — M545 Low back pain: Secondary | ICD-10-CM | POA: Insufficient documentation

## 2015-08-20 NOTE — Therapy (Signed)
[name], [place], [address] Phone: [phone]   Fax:  [phone]  Physical Therapy Evaluation  Patient Details  Name: Christine Conner MRN: [id number] Date of Birth: 08/29/1929 Referring Provider: Asencion Noble MD   Encounter Date: 08/20/2015      PT End of Session - 08/20/15 1701    Visit Number 1   Number of Visits 12   Date for PT Re-Evaluation 09/17/15   Authorization Type [employer] HMO    Authorization Time Period 08/20/15 to 10/18/15   Authorization - Visit Number 1   Authorization - Number of Visits 10   PT Start Time 1301   PT Stop Time 1344   PT Time Calculation (min) 43 min   Activity Tolerance Patient tolerated treatment well   Behavior During Therapy [facility] for tasks assessed/performed      Past Medical History  Diagnosis Date  . Medical history non-contributory     Past Surgical History  Procedure Laterality Date  . Breast cyst excision Right   . Cataract extraction w/phaco Right 05/02/2014    Procedure: CATARACT EXTRACTION PHACO AND INTRAOCULAR LENS PLACEMENT (IOC);  Surgeon: Elta Guadeloupe T. Gershon Crane, MD;  Location: [facility];  Service: Ophthalmology;  Laterality: Right;  CDE:8.79  . Cataract extraction w/phaco Left 05/23/2014    Procedure: CATARACT EXTRACTION PHACO AND INTRAOCULAR LENS PLACEMENT (IOC);  Surgeon: Elta Guadeloupe T. Gershon Crane, MD;  Location: [facility];  Service: Ophthalmology;  Laterality: Left;  CDE 9.01    There were no vitals filed for this visit.  Visit Diagnosis:  Low back pain, unspecified back pain laterality, with sciatica presence unspecified - Plan: PT plan of care cert/re-cert  Muscle weakness - Plan: PT plan of care cert/re-cert  Poor posture - Plan: PT plan of care cert/re-cert  Difficulty walking - Plan: PT plan of care cert/re-cert      Subjective Assessment - 08/20/15 1303    Subjective Pain throughout the day off and on; if she sits too long she will also have increased pain. She  reports that she was very active before all this pain started.    Pertinent History Patient reports she cannot remember when it started exactly, maybe 2-3 months ago, but that she has quite a bit of pain when getting out of bed and walking. Quite a bit of pain when she is extending up from a bent position. Pain throughtout the day.    How long can you sit comfortably? 1/9- 60 minutes    How long can you stand comfortably? 1/9- 30 minutes    How long can you walk comfortably? 1/9- does not hurt as much if she keeps moving    Patient Stated Goals be able to get up without pain    Currently in Pain? Yes   Pain Score 2    Pain Location Back   Pain Orientation Lower   Pain Descriptors / Indicators Sharp;Radiating  can go down either leg at times             [facility] PT Assessment - 08/20/15 0001    Assessment   Medical Diagnosis LBP   Referring Provider Asencion Noble MD    Onset Date/Surgical Date --  2-3 months ago    Next MD Visit no follow-up scheduled right now    Precautions   Precaution Comments anterolisthesis- caution with extension based tasks    Restrictions   Weight Bearing Restrictions No   Balance Screen   Has the patient  fallen in the past 6 months No   Has the patient had a decrease in activity level because of a fear of falling?  No   Is the patient reluctant to leave their home because of a fear of falling?  No   Prior Function   Level of Independence Independent;Independent with basic ADLs;Independent with gait;Independent with transfers   Vocation Retired   Leisure bible study, art   Observation/Other Assessments   Observations no significant LLD noted; patient reports no numbness or tingling    Posture/Postural Control   Posture Comments flexed at hips, forward head with B IR shoulders   AROM   Right Hip External Rotation  --  wfl    Right Hip Internal Rotation  17  wfl    Left Hip External Rotation  --  wfl    Left Hip Internal Rotation  21   Lumbar Flexion  84   Lumbar Extension 12   Lumbar - Right Side Bend fingertips past knee    Lumbar - Left Side Bend fingetrips past knee    Strength   Overall Strength Comments general core 2-3/5    Right Hip Flexion 3+/5   Right Hip Extension 3-/5   Right Hip ABduction 3+/5   Left Hip Flexion 3+/5   Left Hip Extension 3-/5   Left Hip ABduction 3/5   Right Knee Flexion 4/5   Right Knee Extension 4/5   Left Knee Flexion 4-/5   Left Knee Extension 4-/5   Right Ankle Dorsiflexion 4/5   Left Ankle Dorsiflexion 4/5   Ambulation/Gait   Gait Comments pronation bilaterally, flexed at hips, ;proximal muscle weakness    6 minute walk test results    Aerobic Endurance Distance Walked 972   Endurance additional comments 6MWT; gait speed 0.66m/s    High Level Balance   High Level Balance Comments TUG 10.4, 10.4, 10.08                           PT Education - 08/20/15 1701    Education provided Yes   Education Details prognosis, plan of care, HEP    Person(s) Educated Patient   Methods Explanation;Handout;Demonstration   Comprehension Verbalized understanding;Returned demonstration          PT Short Term Goals - 08/20/15 1706    PT SHORT TERM GOAL #1   Title Patient will be able to maintain correct posture at least 75% of the time during all functional siutations and scenarios in order to reduce pain and improve balance    Time 3   Period Weeks   Status New   PT SHORT TERM GOAL #2   Title Patient will experience no more than 4/10 pain with all functional tasks, transfers, and activities in order to enhance patient's ability to participate in tasks at home and in community    Time 3   Period Weeks   Status New   PT SHORT TERM GOAL #3   Title Patient will be able to ambulate at least 1259ft during 6 minute walk test in order to improve overall mobility and assist in return to community based tasks    Time 3   Period Weeks   Status New   PT SHORT TERM GOAL #4   Title Patient  to be independent in correctly and consistently performing appropriate HEP, to be updated PRN    Time 3   Period Weeks   Status New  PT Long Term Goals - September 15, 2015 1708    PT LONG TERM GOAL #1   Title Patient will demonstrate strength 4+/5 in all tested muscle groups in order to enhance regional stability and reduce pain    Time 6   Period Weeks   Status New   PT LONG TERM GOAL #2   Title Patient will experience no more than 2/10 during all functional tasks, transfers, and activities in order to enhance overall function and assist in return to community tasks    Time 6   Period Weeks   Status New   PT LONG TERM GOAL #3   Title Patient to report she has been able to return to community based activities on a regular basis in order to enhance overall function and quality of life    Time 6   Period Weeks   Status New   PT LONG TERM GOAL #4   Title Patient to have initiated participation in water based exercise program at least 2-3 times per week in order to assist in maintaining functional gains and managing back pain    Time 6   Period Weeks   Status New               Plan - Sep 15, 2015 1702    Clinical Impression Statement Patient presents with chronic back pain that appears to be related, at least in part, to anterolisthesis in her lumbar spine. Upon examination patient does demonstrate reduced gait speed, postural impairment, mild impairment on TUG, and significant postural deficits. Patient was educated regarding her anterolisthesis and relation to pain with extension based activities. At this time patient will benefit from skilled PT services in order to address functional impairments and assist in reaching an optimal level of  function.    Pt will benefit from skilled therapeutic intervention in order to improve on the following deficits Decreased activity tolerance;Decreased strength;Pain;Decreased mobility;Improper body mechanics;Impaired flexibility;Postural  dysfunction   Rehab Potential Good   Clinical Impairments Affecting Rehab Potential anterolisthesis   PT Frequency 2x / week   PT Duration 6 weeks   PT Treatment/Interventions ADLs/Self Care Home Management;Moist Heat;Gait training;Stair training;Functional mobility training;Therapeutic exercise;Therapeutic activities;Balance training;Neuromuscular re-education;Patient/family education   PT Next Visit Plan review HEP and goals; functional postural training and proximal strengthening, balance training. Caution with extension activities due to anterolisthesis    PT Home Exercise Plan given    Consulted and Agree with Plan of Care Patient          G-Codes - 09-15-2015 1716    Functional Assessment Tool Used Based on skilled clinical assessment of strength, gait, balance, pain, posture    Functional Limitation Mobility: Walking and moving around   Mobility: Walking and Moving Around Current Status [id number]) At least 40 percent but less than 60 percent impaired, limited or restricted   Mobility: Walking and Moving Around Goal Status [phone]) At least 20 percent but less than 40 percent impaired, limited or restricted       Problem List There are no active problems to display for this patient.   Deniece Ree PT, DPT [phone]  [place] [address] [place], [place], [address] Phone: [phone]   Fax:  [phone]  Name: CAMPBELLE BEKKER MRN: [id number] Date of Birth: November 26, 1929

## 2015-08-20 NOTE — Patient Instructions (Signed)
   HIP EXTENSION - STANDING  While standing, move your leg back as shown. Hold for 2 seconds and then release.  Use your arms for support if needed for balance and safety.  Repeat 10 times each side, 2-3 times per day.    HIP ABDUCTION - STANDING   While standing, raise your leg out to the side. Keep your knee straight and maintain your toes pointed forward the entire time. Hold for 2 seconds and then release.   Use your arms for support if needed for balance and safety.  Repeat 10 times each side, 2-3 times per day.   Knee Extension: Sit to Stand (Eccentric)    Stand close to chair. Slowly lower self to seated position. _5-10__ reps per set, _2__ sets per day, _5-7__ days per week. Progress to stopping midway before lowering to chair. Progress to barely touching chair.  Copyright  VHI. All rights reserved.     SCAPULAR RETRACTIONS  Draw your shoulder blades back and down. It should feel like you are trying to squeeze your shoulder blades together.   Repeat 10 times, 2-3 times per day.

## 2015-08-22 ENCOUNTER — Ambulatory Visit (HOSPITAL_COMMUNITY): Payer: Medicare HMO

## 2015-08-22 DIAGNOSIS — M545 Low back pain: Secondary | ICD-10-CM

## 2015-08-22 DIAGNOSIS — M6281 Muscle weakness (generalized): Secondary | ICD-10-CM

## 2015-08-22 DIAGNOSIS — R293 Abnormal posture: Secondary | ICD-10-CM

## 2015-08-22 DIAGNOSIS — R262 Difficulty in walking, not elsewhere classified: Secondary | ICD-10-CM

## 2015-08-22 NOTE — Therapy (Signed)
[place], [place], [address] Phone: [phone]   Fax:  [phone]  Physical Therapy Treatment  Patient Details  Name: Christine Conner MRN: [id number] Date of Birth: 1930/04/12 Referring Provider: Asencion Noble MD   Encounter Date: 08/22/2015      PT End of Session - 08/22/15 1359    Visit Number 2   Number of Visits 12   Date for PT Re-Evaluation 09/17/15   Authorization Type [employer] HMO    Authorization Time Period 08/20/15 to 10/18/15   Authorization - Visit Number 2   Authorization - Number of Visits 10   PT Start Time 1350   PT Stop Time 1428   PT Time Calculation (min) 38 min   Activity Tolerance Patient tolerated treatment well   Behavior During Therapy [facility] for tasks assessed/performed      Past Medical History  Diagnosis Date  . Medical history non-contributory     Past Surgical History  Procedure Laterality Date  . Breast cyst excision Right   . Cataract extraction w/phaco Right 05/02/2014    Procedure: CATARACT EXTRACTION PHACO AND INTRAOCULAR LENS PLACEMENT (IOC);  Surgeon: Elta Guadeloupe T. Gershon Crane, MD;  Location: [facility];  Service: Ophthalmology;  Laterality: Right;  CDE:8.79  . Cataract extraction w/phaco Left 05/23/2014    Procedure: CATARACT EXTRACTION PHACO AND INTRAOCULAR LENS PLACEMENT (IOC);  Surgeon: Elta Guadeloupe T. Gershon Crane, MD;  Location: [facility];  Service: Ophthalmology;  Laterality: Left;  CDE 9.01    There were no vitals filed for this visit.  Visit Diagnosis:  Low back pain, unspecified back pain laterality, with sciatica presence unspecified  Muscle weakness  Poor posture  Difficulty walking      Subjective Assessment - 08/22/15 1353    Subjective Pt reports lower back pain with radicukar symptoms on buttock, pain scale 5/10 tingling.  She reports more pain with standing, and relief in chair with LE elevated.   Pertinent History Patient reports she cannot remember when it started  exactly, maybe 2-3 months ago, but that she has quite a bit of pain when getting out of bed and walking. Quite a bit of pain when she is extending up from a bent position. Pain throughtout the day.    Patient Stated Goals be able to get up without pain    Currently in Pain? Yes   Pain Score 5    Pain Location Back   Pain Orientation Lower   Pain Descriptors / Indicators Tingling                         [facility] Adult PT Treatment/Exercise - 08/22/15 0001    Exercises   Exercises Lumbar   Lumbar Exercises: Stretches   Active Hamstring Stretch 3 reps;30 seconds   Active Hamstring Stretch Limitations supine with rope   Lumbar Exercises: Standing   Functional Squats 10 reps   Scapular Retraction 10 reps;Theraband   Theraband Level (Scapular Retraction) Level 2 (Red)   Row 10 reps;Theraband   Theraband Level (Row) Level 2 (Red)   Shoulder Extension Both;10 reps;Theraband   Theraband Level (Shoulder Extension) Level 2 (Red)   Lumbar Exercises: Seated   Sit to Stand 10 reps  cueing for technique, able to complete with no HHA   Lumbar Exercises: Supine   Bridge 10 reps   Straight Leg Raise 10 reps   Lumbar Exercises: Sidelying   Hip Abduction 10 reps  PT Short Term Goals - 08/20/15 1706    PT SHORT TERM GOAL #1   Title Patient will be able to maintain correct posture at least 75% of the time during all functional siutations and scenarios in order to reduce pain and improve balance    Time 3   Period Weeks   Status New   PT SHORT TERM GOAL #2   Title Patient will experience no more than 4/10 pain with all functional tasks, transfers, and activities in order to enhance patient's ability to participate in tasks at home and in community    Time 3   Period Weeks   Status New   PT SHORT TERM GOAL #3   Title Patient will be able to ambulate at least 1273ft during 6 minute walk test in order to improve overall mobility and assist in return to  community based tasks    Time 3   Period Weeks   Status New   PT SHORT TERM GOAL #4   Title Patient to be independent in correctly and consistently performing appropriate HEP, to be updated PRN    Time 3   Period Weeks   Status New           PT Long Term Goals - 08/20/15 1708    PT LONG TERM GOAL #1   Title Patient will demonstrate strength 4+/5 in all tested muscle groups in order to enhance regional stability and reduce pain    Time 6   Period Weeks   Status New   PT LONG TERM GOAL #2   Title Patient will experience no more than 2/10 during all functional tasks, transfers, and activities in order to enhance overall function and assist in return to community tasks    Time 6   Period Weeks   Status New   PT LONG TERM GOAL #3   Title Patient to report she has been able to return to community based activities on a regular basis in order to enhance overall function and quality of life    Time 6   Period Weeks   Status New   PT LONG TERM GOAL #4   Title Patient to have initiated participation in water based exercise program at least 2-3 times per week in order to assist in maintaining functional gains and managing back pain    Time 6   Period Weeks   Status New               Plan - 08/22/15 1428    Clinical Impression Statement Reviewed goals, compliance and technique with HEP and copy of evaluation given to pt.  Session focus on proximal musculature strengthening and awareness of improving posture.  Pt able to complete all exercises with good form following initial cueing for technqiue.  Pt c/o hamstring cramping during bridges, resolved with hamstring stretches.  Pt able to complete bridges and squats with good form with no reports of increased pain with extension activities, continue to be cautious due to anterolistesis.  End of session pt reports pain resolved with no symptoms.   PT Next Visit Plan Continue wiht functional postural training and proximal strengthening,  balance training. Caution with extension activities due to anterolisthesis         Problem List There are no active problems to display for this patient.  [address], LPTA; [place]  Aldona Lento 08/22/2015, 2:40 PM  [place] [address] [place], [place], [address] Phone:  [phone]   Fax:  [phone]  Name: Christine Conner MRN: [id number] Date of Birth: 01-18-1930

## 2015-08-28 ENCOUNTER — Ambulatory Visit (HOSPITAL_COMMUNITY): Payer: Medicare HMO

## 2015-08-28 DIAGNOSIS — R293 Abnormal posture: Secondary | ICD-10-CM

## 2015-08-28 DIAGNOSIS — M545 Low back pain: Secondary | ICD-10-CM | POA: Diagnosis not present

## 2015-08-28 DIAGNOSIS — M6281 Muscle weakness (generalized): Secondary | ICD-10-CM

## 2015-08-28 DIAGNOSIS — R262 Difficulty in walking, not elsewhere classified: Secondary | ICD-10-CM

## 2015-08-28 NOTE — Patient Instructions (Signed)
Double Knee to Chest (Flexion)   Gently pull both knees toward chest. Feel stretch in lower back or buttock area. Breathing deeply, Hold _30__ seconds. Repeat __3__ times. Do __3__ sessions per day.  Knee to Chest (Flexion)   Pull knee toward chest. Feel stretch in lower back or buttock area. Breathing deeply, Hold _30___ seconds. Repeat with other knee. Repeat __3__ times. Do __3__ sessions per day.   Pelvic Tilt: Posterior - Legs Bent (Supine)    Tighten stomach and flatten back by rolling pelvis down. Hold __5__ seconds. Relax. Repeat __10__ times per set. Do __2__ sets per session. Do __3__ sessions per day.  http://orth.exer.us/203   Copyright  VHI. All rights reserved.      Isometric Abdominal   Lying on back with knees bent, tighten stomach by pressing elbows down. Hold _5___ seconds. Repeat __10__ times per set. Do ___2_ sets per session. Do __3__ sessions per day.

## 2015-08-28 NOTE — Therapy (Addendum)
[place], [place], [address] Phone: [phone]   Fax:  [phone]  Physical Therapy Treatment  Patient Details  Name: Christine Conner MRN: [id number] Date of Birth: 1930/05/06 Referring Provider: Asencion Noble MD   Encounter Date: 08/28/2015      PT End of Session - 08/28/15 1349    Visit Number 3   Number of Visits 12   Date for PT Re-Evaluation 09/17/15   Authorization Type [employer] Medicare HMO    Authorization Time Period 08/20/15 to 10/18/15   Authorization - Visit Number 3   Authorization - Number of Visits 10   PT Start Time 0100   PT Stop Time 0145   PT Time Calculation (min) 45 min   Activity Tolerance Patient tolerated treatment well   Behavior During Therapy [facility] for tasks assessed/performed      Past Medical History  Diagnosis Date  . Medical history non-contributory     Past Surgical History  Procedure Laterality Date  . Breast cyst excision Right   . Cataract extraction w/phaco Right 05/02/2014    Procedure: CATARACT EXTRACTION PHACO AND INTRAOCULAR LENS PLACEMENT (IOC);  Surgeon: Elta Guadeloupe T. Gershon Crane, MD;  Location: [facility];  Service: Ophthalmology;  Laterality: Right;  CDE:8.79  . Cataract extraction w/phaco Left 05/23/2014    Procedure: CATARACT EXTRACTION PHACO AND INTRAOCULAR LENS PLACEMENT (IOC);  Surgeon: Elta Guadeloupe T. Gershon Crane, MD;  Location: [facility];  Service: Ophthalmology;  Laterality: Left;  CDE 9.01    There were no vitals filed for this visit.  Visit Diagnosis:  Low back pain, unspecified back pain laterality, with sciatica presence unspecified  Muscle weakness  Poor posture  Difficulty walking      Subjective Assessment - 08/28/15 1326    Subjective Pt denies pain or radicular symptoms currently. Pt reports compliance with HEP, but difficulty with sit to stand activity. Pt reports increased pain in AM with pain rating 4/10 this AM.    Currently in Pain? No/denies   Pain Location Back   Pain Orientation Lower   Pain Descriptors / Indicators Aching            [facility] PT Assessment - 08/28/15 0001    Observation/Other Assessments   Observations BP: 110/70 pre tx, 120/72 post tx.                      [facility] Adult PT Treatment/Exercise - 08/28/15 0001    Lumbar Exercises: Stretches   Single Knee to Chest Stretch 3 reps;30 seconds  HEP   Single Knee to Chest Stretch Limitations bilat   Double Knee to Chest Stretch 3 reps;30 seconds  HEP   Lower Trunk Rotation 3 reps;10 seconds   Pelvic Tilt Other (comment)  50% effort due to HS cramping.    Pelvic Tilt Limitations 5 sec x 10 reps   Lumbar Exercises: Seated   Sit to Stand 10 reps  VCs for core brace & breathing during sit to stand activity   Sit to Stand Limitations SEATED LUMBAR FLEXION STRETCH, 3, 30 sec holds.    Lumbar Exercises: Supine   Ab Set 10 reps;5 seconds                PT Education - 08/28/15 1348    Education provided Yes   Education Details Prescribed HEP: lumbar flexion ther ex and core strengthening. Instructed to discontinue any ther ex that increases pain.    Person(s) Educated Patient   Methods  Explanation;Demonstration;Verbal cues   Comprehension Verbalized understanding;Returned demonstration;Verbal cues required          PT Short Term Goals - 08/20/15 1706    PT SHORT TERM GOAL #1   Title Patient will be able to maintain correct posture at least 75% of the time during all functional siutations and scenarios in order to reduce pain and improve balance    Time 3   Period Weeks   Status New   PT SHORT TERM GOAL #2   Title Patient will experience no more than 4/10 pain with all functional tasks, transfers, and activities in order to enhance patient's ability to participate in tasks at home and in community    Time 3   Period Weeks   Status New   PT SHORT TERM GOAL #3   Title Patient will be able to ambulate at least 1294ft during 6 minute walk test in order to  improve overall mobility and assist in return to community based tasks    Time 3   Period Weeks   Status New   PT SHORT TERM GOAL #4   Title Patient to be independent in correctly and consistently performing appropriate HEP, to be updated PRN    Time 3   Period Weeks   Status New           PT Long Term Goals - 08/20/15 1708    PT LONG TERM GOAL #1   Title Patient will demonstrate strength 4+/5 in all tested muscle groups in order to enhance regional stability and reduce pain    Time 6   Period Weeks   Status New   PT LONG TERM GOAL #2   Title Patient will experience no more than 2/10 during all functional tasks, transfers, and activities in order to enhance overall function and assist in return to community tasks    Time 6   Period Weeks   Status New   PT LONG TERM GOAL #3   Title Patient to report she has been able to return to community based activities on a regular basis in order to enhance overall function and quality of life    Time 6   Period Weeks   Status New   PT LONG TERM GOAL #4   Title Patient to have initiated participation in water based exercise program at least 2-3 times per week in order to assist in maintaining functional gains and managing back pain    Time 6   Period Weeks   Status New               Plan - 08/28/15 1350    Clinical Impression Statement Progressed HEP to include gentle lumbar flexion-based ther ex with core strengthening. Pt demonstrated good understanding and mm recruitment during abdom brace. Pt had increased HS crampoing initially with PPT activity, but cramping abolished when instructed to perform PPT with 50% effort instead of 100% effort. Pt was given written HEP for SKTC, DKTC, PPT, and abdom brace activity. No increase in pain today with any activities and pt demonstrated understanding of HEP.  Addendum: BP: 110/70 pre tx, 120/72 post tx.   PT Next Visit Plan Review HEP. Core strengthening/ progression to core brace with  functional activities. Avoid extension-based movements.    Consulted and Agree with Plan of Care Patient        Problem List There are no active problems to display for this patient.   Dollene Cleveland, PT 08/28/2015, 2:25 PM    [facility] [address] [place], [place], [address] Phone: [phone]   Fax:  [phone]  Name: Christine Conner MRN: [id number] Date of Birth: Nov 03, 1929

## 2015-08-30 ENCOUNTER — Ambulatory Visit (HOSPITAL_COMMUNITY): Payer: Medicare HMO

## 2015-08-30 DIAGNOSIS — R293 Abnormal posture: Secondary | ICD-10-CM

## 2015-08-30 DIAGNOSIS — R262 Difficulty in walking, not elsewhere classified: Secondary | ICD-10-CM

## 2015-08-30 DIAGNOSIS — M545 Low back pain: Secondary | ICD-10-CM

## 2015-08-30 DIAGNOSIS — M6281 Muscle weakness (generalized): Secondary | ICD-10-CM

## 2015-08-30 NOTE — Therapy (Signed)
[place], [place], [address] Phone: [phone]   Fax:  [phone]  Physical Therapy Treatment  Patient Details  Name: Christine Conner MRN: [id number] Date of Birth: 05-Jan-1930 Referring Provider: Asencion Noble MD   Encounter Date: 08/30/2015      PT End of Session - 08/30/15 1401    Visit Number 4   Number of Visits 12   Date for PT Re-Evaluation 09/17/15   Authorization Type [employer] Medicare HMO    Authorization Time Period 08/20/15 to 10/18/15   Authorization - Visit Number 4   Authorization - Number of Visits 10   PT Start Time 0100   PT Stop Time 0145   PT Time Calculation (min) 45 min   Activity Tolerance Patient tolerated treatment well   Behavior During Therapy [facility] for tasks assessed/performed      Past Medical History  Diagnosis Date  . Medical history non-contributory     Past Surgical History  Procedure Laterality Date  . Breast cyst excision Right   . Cataract extraction w/phaco Right 05/02/2014    Procedure: CATARACT EXTRACTION PHACO AND INTRAOCULAR LENS PLACEMENT (IOC);  Surgeon: Elta Guadeloupe T. Gershon Crane, MD;  Location: [facility];  Service: Ophthalmology;  Laterality: Right;  CDE:8.79  . Cataract extraction w/phaco Left 05/23/2014    Procedure: CATARACT EXTRACTION PHACO AND INTRAOCULAR LENS PLACEMENT (IOC);  Surgeon: Elta Guadeloupe T. Gershon Crane, MD;  Location: [facility];  Service: Ophthalmology;  Laterality: Left;  CDE 9.01    There were no vitals filed for this visit.  Visit Diagnosis:  Low back pain, unspecified back pain laterality, with sciatica presence unspecified  Muscle weakness  Poor posture  Difficulty walking      Subjective Assessment - 08/30/15 1303    Subjective Pt reports partial compliance with HEP since last visit, performing the exercises only 2 times yesterday. Reviewed importance of perfoming exercises 3 times daily. Pt also notes continued back pain in AM especially upon waking and with  extension up straight into standing, but is better if she stays bent forward.    Currently in Pain? No/denies   Pain Score --  0 now, but 8/10  in AM   Pain Location Back   Pain Orientation Lower   Pain Descriptors / Indicators Aching   Pain Type Chronic pain   Pain Onset More than a month ago   Pain Frequency Intermittent   Aggravating Factors  worse in AM ,    Pain Relieving Factors advil                         [facility] Adult PT Treatment/Exercise - 08/30/15 0001    Lumbar Exercises: Stretches   Single Knee to Chest Stretch 3 reps;30 seconds   Single Knee to Chest Stretch Limitations bilat   Double Knee to Chest Stretch 3 reps;30 seconds   Lower Trunk Rotation 3 reps;30 seconds   Pelvic Tilt Other (comment)   Pelvic Tilt Limitations 5 sec hold x 20 reps    Lumbar Exercises: Supine   Ab Set 10 reps;5 seconds   AB Set Limitations with shoulder ext isometric into mat   Clam 10 reps  HEP   Clam Limitations supine with ab set and red theraband.    Large Ball Abdominal Isometric 10 reps   Large Ball Abdominal Isometric Limitations small green PB, 5 sec hold   Other Supine Lumbar Exercises Core brace with hip ADD. 10 x  Manual Therapy   Manual Therapy Manual Traction   Manual therapy comments attempted manual lumbar traction with belt, pt in hooklying. No effect from manual traction- unable to feel distraction in low back, only pressure on thighs and hips. Disconitnued after attempt - 5 mins.    Manual Traction 5 mins                 PT Education - 08/30/15 1400    Education provided Yes   Education Details Instructed pt to continue with current HEP and added clam to supine abdom brace without resistance.    Person(s) Educated Patient   Methods Explanation;Demonstration;Verbal cues   Comprehension Verbalized understanding;Returned demonstration;Verbal cues required          PT Short Term Goals - 08/30/15 1402    PT SHORT TERM GOAL #1   Title  Patient will be able to maintain correct posture at least 75% of the time during all functional siutations and scenarios in order to reduce pain and improve balance    Time 3   Period Weeks   Status On-going   PT SHORT TERM GOAL #2   Title Patient will experience no more than 4/10 pain with all functional tasks, transfers, and activities in order to enhance patient's ability to participate in tasks at home and in community    Time 3   Period Weeks   Status On-going   PT SHORT TERM GOAL #3   Title Patient will be able to ambulate at least 121ft during 6 minute walk test in order to improve overall mobility and assist in return to community based tasks    Time 3   Period Weeks   Status On-going   PT SHORT TERM GOAL #4   Title Patient to be independent in correctly and consistently performing appropriate HEP, to be updated PRN    Time 3   Period Weeks   Status On-going           PT Long Term Goals - 08/30/15 1403    PT LONG TERM GOAL #1   Title Patient will demonstrate strength 4+/5 in all tested muscle groups in order to enhance regional stability and reduce pain    Time 6   Period Weeks   Status On-going   PT LONG TERM GOAL #2   Title Patient will experience no more than 2/10 during all functional tasks, transfers, and activities in order to enhance overall function and assist in return to community tasks    Time 6   Period Weeks   Status On-going   PT LONG TERM GOAL #3   Title Patient to report she has been able to return to community based activities on a regular basis in order to enhance overall function and quality of life    Time 6   Period Weeks   Status On-going   PT LONG TERM GOAL #4   Title Patient to have initiated participation in water based exercise program at least 2-3 times per week in order to assist in maintaining functional gains and managing back pain    Time 6   Period Weeks   Status On-going               Plan - 08/30/15 1406    Clinical  Impression Statement Progressed core strengthening program to include Abd brace and PPT coupled with : clam with theraband, hip ADD, shoulder ext press into mat, UE press into PB. Pt tolerated all ther ex well without  increase in pain or discomfort. Pt demonstrated good core contraction throughout while maintaining breath control. Pt would benefit from core strengthening progressed to seated position for more functional training.     Pt will benefit from skilled therapeutic intervention in order to improve on the following deficits Decreased activity tolerance;Decreased strength;Pain;Decreased mobility;Improper body mechanics;Impaired flexibility;Postural dysfunction   Rehab Potential Good   PT Frequency 2x / week   PT Duration 6 weeks   PT Treatment/Interventions ADLs/Self Care Home Management;Moist Heat;Gait training;Stair training;Functional mobility training;Therapeutic exercise;Therapeutic activities;Balance training;Neuromuscular re-education;Patient/family education   PT Next Visit Plan Review HEP. Core strengthening/ progression to core brace with functional activities. Avoid extension-based movements.    Consulted and Agree with Plan of Care Patient        Problem List There are no active problems to display for this patient.   Dollene Cleveland, PT 08/30/2015, 2:11 PM  [facility] [address] [place], [place], [address] Phone: [phone]   Fax:  [phone]  Name: Christine Conner MRN: [id number] Date of Birth: 04/16/1930

## 2015-09-04 ENCOUNTER — Ambulatory Visit (HOSPITAL_COMMUNITY): Payer: Medicare HMO

## 2015-09-04 DIAGNOSIS — R262 Difficulty in walking, not elsewhere classified: Secondary | ICD-10-CM

## 2015-09-04 DIAGNOSIS — M6281 Muscle weakness (generalized): Secondary | ICD-10-CM

## 2015-09-04 DIAGNOSIS — R293 Abnormal posture: Secondary | ICD-10-CM

## 2015-09-04 DIAGNOSIS — M545 Low back pain: Secondary | ICD-10-CM | POA: Diagnosis not present

## 2015-09-04 NOTE — Therapy (Signed)
[place], [place], [address] Phone: [phone]   Fax:  [phone]  Physical Therapy Treatment  Patient Details  Name: Christine Conner MRN: [id number] Date of Birth: 1929/09/04 Referring Provider: Asencion Noble MD   Encounter Date: 09/04/2015      PT End of Session - 09/04/15 1412    Visit Number 5   Number of Visits 12   Date for PT Re-Evaluation 09/17/15   Authorization Type [employer] HMO    Authorization Time Period 08/20/15 to 10/18/15   Authorization - Visit Number 5   Authorization - Number of Visits 10   PT Start Time 1350   PT Stop Time 1430   PT Time Calculation (min) 40 min   Activity Tolerance Patient tolerated treatment well   Behavior During Therapy [facility] for tasks assessed/performed      Past Medical History  Diagnosis Date  . Medical history non-contributory     Past Surgical History  Procedure Laterality Date  . Breast cyst excision Right   . Cataract extraction w/phaco Right 05/02/2014    Procedure: CATARACT EXTRACTION PHACO AND INTRAOCULAR LENS PLACEMENT (IOC);  Surgeon: Elta Guadeloupe T. Gershon Crane, MD;  Location: [facility];  Service: Ophthalmology;  Laterality: Right;  CDE:8.79  . Cataract extraction w/phaco Left 05/23/2014    Procedure: CATARACT EXTRACTION PHACO AND INTRAOCULAR LENS PLACEMENT (IOC);  Surgeon: Elta Guadeloupe T. Gershon Crane, MD;  Location: [facility];  Service: Ophthalmology;  Laterality: Left;  CDE 9.01    There were no vitals filed for this visit.  Visit Diagnosis:  Low back pain, unspecified back pain laterality, with sciatica presence unspecified  Muscle weakness  Poor posture  Difficulty walking      Subjective Assessment - 09/04/15 1411    Subjective Pt stated majority of main in morining time, pain reduces through out the day.  Reports compliance with HEP.  Pain free at entrance.   Pertinent History Patient reports she cannot remember when it started exactly, maybe 2-3 months ago, but  that she has quite a bit of pain when getting out of bed and walking. Quite a bit of pain when she is extending up from a bent position. Pain throughtout the day.    Patient Stated Goals be able to get up without pain    Currently in Pain? No/denies                         [facility] Adult PT Treatment/Exercise - 09/04/15 0001    Lumbar Exercises: Stretches   Active Hamstring Stretch 3 reps;30 seconds   Active Hamstring Stretch Limitations supine with rope   Single Knee to Chest Stretch 3 reps;30 seconds   Single Knee to Chest Stretch Limitations bilat  HEP   Double Knee to Chest Stretch 3 reps;30 seconds   Lower Trunk Rotation 5 reps;10 seconds  HEP   Pelvic Tilt Other (comment)   Pelvic Tilt Limitations 5 sec hold x 20 reps    Lumbar Exercises: Standing   Functional Squats 10 reps   Forward Lunge 10 reps   Forward Lunge Limitations 6in step   Lumbar Exercises: Seated   Hip Flexion on Ball Both;10 reps   Hip Flexion on Ball Limitations Marching 5" holds with ab set on Dynadisc   Lumbar Exercises: Supine   Ab Set 10 reps;5 seconds   AB Set Limitations with shoulder ext isometric into mat   Large Ball Abdominal Isometric 10 reps;5 seconds  Large Ball Abdominal Isometric Limitations small green theraball 5" holds   Large Ball Oblique Isometric 5 reps;5 seconds   Large Ball Oblique Isometric Limitations small green theraball 5" holds                  PT Short Term Goals - 08/30/15 1402    PT SHORT TERM GOAL #1   Title Patient will be able to maintain correct posture at least 75% of the time during all functional siutations and scenarios in order to reduce pain and improve balance    Time 3   Period Weeks   Status On-going   PT SHORT TERM GOAL #2   Title Patient will experience no more than 4/10 pain with all functional tasks, transfers, and activities in order to enhance patient's ability to participate in tasks at home and in community    Time 3   Period  Weeks   Status On-going   PT SHORT TERM GOAL #3   Title Patient will be able to ambulate at least 1224ft during 6 minute walk test in order to improve overall mobility and assist in return to community based tasks    Time 3   Period Weeks   Status On-going   PT SHORT TERM GOAL #4   Title Patient to be independent in correctly and consistently performing appropriate HEP, to be updated PRN    Time 3   Period Weeks   Status On-going           PT Long Term Goals - 08/30/15 1403    PT LONG TERM GOAL #1   Title Patient will demonstrate strength 4+/5 in all tested muscle groups in order to enhance regional stability and reduce pain    Time 6   Period Weeks   Status On-going   PT LONG TERM GOAL #2   Title Patient will experience no more than 2/10 during all functional tasks, transfers, and activities in order to enhance overall function and assist in return to community tasks    Time 6   Period Weeks   Status On-going   PT LONG TERM GOAL #3   Title Patient to report she has been able to return to community based activities on a regular basis in order to enhance overall function and quality of life    Time 6   Period Weeks   Status On-going   PT LONG TERM GOAL #4   Title Patient to have initiated participation in water based exercise program at least 2-3 times per week in order to assist in maintaining functional gains and managing back pain    Time 6   Period Weeks   Status On-going               Plan - 09/04/15 1421    Clinical Impression Statement Sessiion focus on improving core and proximal musculature.  Progressed core strengtening exercises with theraball, sitting hip flexion exercises on dynamic surface and began standing forward lunges for functional strengthening.  Minimal therapist facilitation required through session to improve core activation prior activity.  No reports of pain through session.     PT Next Visit Plan Core strengthening/ progression to core  brace with functional activities. Avoid extension-based movements.         Problem List There are no active problems to display for this patient.  [address], LPTA; [place]  Aldona Lento 09/04/2015, 2:33 PM  [facility]  [place], [place], [address] Phone: [phone]   Fax:  [phone]  Name: Christine Conner MRN: [id number] Date of Birth: Jul 01, 1930

## 2015-09-06 ENCOUNTER — Ambulatory Visit (HOSPITAL_COMMUNITY): Payer: Medicare HMO

## 2015-09-06 ENCOUNTER — Encounter (HOSPITAL_COMMUNITY): Payer: Self-pay

## 2015-09-06 DIAGNOSIS — R293 Abnormal posture: Secondary | ICD-10-CM

## 2015-09-06 DIAGNOSIS — R262 Difficulty in walking, not elsewhere classified: Secondary | ICD-10-CM

## 2015-09-06 DIAGNOSIS — M545 Low back pain: Secondary | ICD-10-CM

## 2015-09-06 DIAGNOSIS — M6281 Muscle weakness (generalized): Secondary | ICD-10-CM

## 2015-09-06 NOTE — Therapy (Signed)
[place], [place], [address] Phone: [phone]   Fax:  [phone]  Physical Therapy Treatment  Patient Details  Name: RAMATA STROTHMAN MRN: [id number] Date of Birth: 11-Jan-1930 Referring Provider: Asencion Noble MD   Encounter Date: 09/06/2015      PT End of Session - 09/06/15 1326    Visit Number 6   Number of Visits 12   Date for PT Re-Evaluation 09/17/15   Authorization Type [employer] HMO    Authorization Time Period 08/20/15 to 10/18/15   Authorization - Visit Number 6   Authorization - Number of Visits 10   PT Start Time 1301   PT Stop Time 1345   PT Time Calculation (min) 44 min   Activity Tolerance Patient tolerated treatment well   Behavior During Therapy [facility] for tasks assessed/performed      Past Medical History  Diagnosis Date  . Medical history non-contributory     Past Surgical History  Procedure Laterality Date  . Breast cyst excision Right   . Cataract extraction w/phaco Right 05/02/2014    Procedure: CATARACT EXTRACTION PHACO AND INTRAOCULAR LENS PLACEMENT (IOC);  Surgeon: Elta Guadeloupe T. Gershon Crane, MD;  Location: [facility];  Service: Ophthalmology;  Laterality: Right;  CDE:8.79  . Cataract extraction w/phaco Left 05/23/2014    Procedure: CATARACT EXTRACTION PHACO AND INTRAOCULAR LENS PLACEMENT (IOC);  Surgeon: Elta Guadeloupe T. Gershon Crane, MD;  Location: [facility];  Service: Ophthalmology;  Laterality: Left;  CDE 9.01    There were no vitals filed for this visit.  Visit Diagnosis:  Low back pain, unspecified back pain laterality, with sciatica presence unspecified  Poor posture  Difficulty walking  Muscle weakness      Subjective Assessment - 09/06/15 1303    Subjective Pt denied pain upon arrival while sitting. LBP has ranged between a 0-5/10 on a VAS since last PT visit. Pt reports less severe LBP since beginning with PT  and further reported 50% improvement overall.   Pertinent History Patient reports she  cannot remember when it started exactly, maybe 2-3 months ago, but that she has quite a bit of pain when getting out of bed and walking. Quite a bit of pain when she is extending up from a bent position. Pain throughtout the day.    How long can you sit comfortably? 1/26- 60 minutes    How long can you stand comfortably? 1/26- 30 minutes    How long can you walk comfortably? 1/26- no limitations    Diagnostic tests N/A   Patient Stated Goals reduce LBP    Currently in Pain? No/denies   Pain Score 0-No pain   Pain Location Back   Pain Orientation Lower;Right   Pain Descriptors / Indicators Aching   Pain Type Chronic pain   Pain Onset More than a month ago   Pain Frequency Intermittent   Aggravating Factors  worse in the morning and when standing for >60 sec    Pain Relieving Factors Ibuprofen and changing position   Multiple Pain Sites No                         [facility] Adult PT Treatment/Exercise - 09/06/15 0001    Lumbar Exercises: Stretches   Active Hamstring Stretch 3 reps;30 seconds   Active Hamstring Stretch Limitations supine with rope   Single Knee to Chest Stretch 3 reps;30 seconds   Single Knee to Chest Stretch Limitations bilat  Double Knee to Chest Stretch 3 reps;30 seconds   Lower Trunk Rotation 5 reps;10 seconds   Pelvic Tilt Other (comment)   Pelvic Tilt Limitations 5 sec hold x 20 reps    Lumbar Exercises: Standing   Functional Squats 15 reps;Other (comment)  with use of physioball in corner of room    Lumbar Exercises: Supine   Ab Set 5 seconds;15 reps;Other (comment)  with alternating shd and hip flexion   AB Set Limitations with shoulder ext isometric into mat   Clam 15 reps   Clam Limitations supine with ab set and red theraband, bilaterally   Large Ball Abdominal Isometric 15 reps;5 seconds   Large Ball Abdominal Isometric Limitations small green theraball 5" holds   Manual Therapy   Manual Therapy Muscle Energy Technique;Other (comment)   to R and L LE to improve pelvic alignment    Passive ROM B hip PROM into flexion and extension    Muscle Energy Technique Resisted R hip ext and L hip flexion                PT Education - 09/06/15 1322    Education provided Yes   Education Details Educated the pt on proper sitting posture with use of lumbar roll, core activation with daily activities, use of moist heat pack ( 15-20 minutes, 3-4x/day, use of small towel to protect skin) to reduce LBP, HEP,  and log rolling technique to reduce stress on lumbar spine   Person(s) Educated Patient   Methods Explanation;Demonstration;Verbal cues   Comprehension Verbalized understanding;Returned demonstration;Need further instruction          PT Short Term Goals - 08/30/15 1402    PT SHORT TERM GOAL #1   Title Patient will be able to maintain correct posture at least 75% of the time during all functional siutations and scenarios in order to reduce pain and improve balance    Time 3   Period Weeks   Status On-going   PT SHORT TERM GOAL #2   Title Patient will experience no more than 4/10 pain with all functional tasks, transfers, and activities in order to enhance patient's ability to participate in tasks at home and in community    Time 3   Period Weeks   Status On-going   PT SHORT TERM GOAL #3   Title Patient will be able to ambulate at least 124f during 6 minute walk test in order to improve overall mobility and assist in return to community based tasks    Time 3   Period Weeks   Status On-going   PT SHORT TERM GOAL #4   Title Patient to be independent in correctly and consistently performing appropriate HEP, to be updated PRN    Time 3   Period Weeks   Status On-going           PT Long Term Goals - 08/30/15 1403    PT LONG TERM GOAL #1   Title Patient will demonstrate strength 4+/5 in all tested muscle groups in order to enhance regional stability and reduce pain    Time 6   Period Weeks   Status On-going    PT LONG TERM GOAL #2   Title Patient will experience no more than 2/10 during all functional tasks, transfers, and activities in order to enhance overall function and assist in return to community tasks    Time 6   Period Weeks   Status On-going   PT LONG TERM GOAL #3  Title Patient to report she has been able to return to community based activities on a regular basis in order to enhance overall function and quality of life    Time 6   Period Weeks   Status On-going   PT LONG TERM GOAL #4   Title Patient to have initiated participation in water based exercise program at least 2-3 times per week in order to assist in maintaining functional gains and managing back pain    Time 6   Period Weeks   Status On-going               Plan - 09/06/15 1326    Clinical Impression Statement Pt progressed well through PT ther ex regimen with no issues reported. Core stabilization ther ex were progressed this visit with additional reps completed with abdominal isometrics with physioball and added alternating hip and shoulder flexion with Tr. Ab activation in supine. Verbal cues required for proper core activation and to avoid holding breath. Improved activation and technique assessed once verbal cues provided. Reps were increased with S/L hip ER with good tolerance reported. Pt would benefit from additional sets/reps at next PT visit in order to further progress towards improved strength. R anterior rotated innominate assessed with 1/2 inch difference noted when compared contralaterally. Completed MET to R and L LE with pelvic alignment returning to neutral position. Reviewed and completed squats with use of a physioball in order to improve focus on technique and core activation. Pt tolerated tx well with LBP remaining at a 0/10 on a VAS at the completion of today's PT visit. Instructed the pt to remain complaint with her HEP and to complete SKTC and DKTC upon awakening in order to reduce pain in the  morning. The pt would benefit from continued skilled PT in order to further improve core stabilization, reduce LBP, and improve LE flexibility.   Pt will benefit from skilled therapeutic intervention in order to improve on the following deficits Decreased activity tolerance;Decreased strength;Pain;Decreased mobility;Improper body mechanics;Impaired flexibility;Postural dysfunction   Rehab Potential Good   Clinical Impairments Affecting Rehab Potential anterolisthesis   PT Frequency 2x / week   PT Duration 6 weeks   PT Treatment/Interventions ADLs/Self Care Home Management;Moist Heat;Gait training;Stair training;Functional mobility training;Therapeutic exercise;Therapeutic activities;Balance training;Neuromuscular re-education;Patient/family education;Passive range of motion;Manual techniques   PT Next Visit Plan Next visit to focus on supine and seated progressed Tr. Abdominis strengthening, quad/hip flexor stretch, SKTC/DKTC, and physioball squats with focus on maintaining core activated. Avoid extension- based movements    PT Home Exercise Plan HEP reviewed - add progressed core stabilization ther ex to HEP at next PT appt    Consulted and Agree with Plan of Care Patient        Problem List There are no active problems to display for this patient.   Garen Lah, PT, DPT     09/06/2015, 2:06 PM  [place], [place], [address] Phone: [phone]   Fax:  [phone]  Name: LESLI ISSA MRN: [id number] Date of Birth: Dec 23, 1929

## 2015-09-11 ENCOUNTER — Ambulatory Visit (HOSPITAL_COMMUNITY): Payer: Medicare HMO

## 2015-09-11 DIAGNOSIS — R293 Abnormal posture: Secondary | ICD-10-CM

## 2015-09-11 DIAGNOSIS — R262 Difficulty in walking, not elsewhere classified: Secondary | ICD-10-CM

## 2015-09-11 DIAGNOSIS — M6281 Muscle weakness (generalized): Secondary | ICD-10-CM

## 2015-09-11 DIAGNOSIS — M545 Low back pain: Secondary | ICD-10-CM

## 2015-09-11 NOTE — Patient Instructions (Addendum)
  Hooklying Shoulder Flexion  Lay on back, knees bent, and both arms at your sides. Keeping your core tight, lift one arm straight up in front of you and lift opposite leg a few inches off table, and lower back down. Repeat on opposite side. Complete 2 sets of 10 reps, 1x/day, everyday

## 2015-09-11 NOTE — Therapy (Signed)
[place], [place], [address] Phone: [phone]   Fax:  [phone]  Physical Therapy Treatment  Patient Details  Name: Christine Conner MRN: [id number] Date of Birth: Dec 13, 1929 Referring Provider: Asencion Noble MD   Encounter Date: 09/11/2015      PT End of Session - 09/11/15 1312    Visit Number 7   Number of Visits 12   Date for PT Re-Evaluation 09/17/15   Authorization Type [employer] HMO    Authorization Time Period 08/20/15 to 10/18/15   Authorization - Visit Number 7   Authorization - Number of Visits 10   PT Start Time 1300   PT Stop Time 6553   PT Time Calculation (min) 43 min   Activity Tolerance Patient tolerated treatment well;No increased pain   Behavior During Therapy [facility] for tasks assessed/performed      Past Medical History  Diagnosis Date  . Medical history non-contributory     Past Surgical History  Procedure Laterality Date  . Breast cyst excision Right   . Cataract extraction w/phaco Right 05/02/2014    Procedure: CATARACT EXTRACTION PHACO AND INTRAOCULAR LENS PLACEMENT (IOC);  Surgeon: Elta Guadeloupe T. Gershon Crane, MD;  Location: [facility];  Service: Ophthalmology;  Laterality: Right;  CDE:8.79  . Cataract extraction w/phaco Left 05/23/2014    Procedure: CATARACT EXTRACTION PHACO AND INTRAOCULAR LENS PLACEMENT (IOC);  Surgeon: Elta Guadeloupe T. Gershon Crane, MD;  Location: [facility];  Service: Ophthalmology;  Laterality: Left;  CDE 9.01    There were no vitals filed for this visit.  Visit Diagnosis:  Low back pain, unspecified back pain laterality, with sciatica presence unspecified  Difficulty walking  Muscle weakness  Poor posture      Subjective Assessment - 09/11/15 1303    Subjective Pt denied pain upon arrival while sitting. LBP has ranged between a 0-6/10 on a VAS since last PT visit. Pt noted that her pain continues to be the worst when she awakes in the morning with symptoms subsiding completely after  30 minutes.    Pertinent History Patient reports she cannot remember when it started exactly, maybe 2-3 months ago, but that she has quite a bit of pain when getting out of bed and walking. Quite a bit of pain when she is extending up from a bent position. Pain throughtout the day.    How long can you sit comfortably? 1/30- 60 minutes    How long can you stand comfortably? 1/30- 30 minutes    How long can you walk comfortably? 1/30- no limitations    Diagnostic tests N/A   Patient Stated Goals reduce LBP    Currently in Pain? No/denies   Pain Score 0-No pain   Pain Location Back   Pain Orientation Lower;Right   Pain Descriptors / Indicators Aching   Pain Type Chronic pain   Pain Onset More than a month ago   Pain Frequency Intermittent   Aggravating Factors  worse in the morning and when standing >1 hour    Pain Relieving Factors walking, ibuprofen, and changing position    Multiple Pain Sites No                         [facility] Adult PT Treatment/Exercise - 09/11/15 0001    Lumbar Exercises: Stretches   Active Hamstring Stretch 3 reps;30 seconds   Active Hamstring Stretch Limitations supine with rope   Single Knee to Chest Stretch 3 reps;30 seconds  Single Knee to Chest Stretch Limitations bilat         Lower Trunk Rotation 5 reps;10 seconds   Pelvic Tilt Other (comment)   Pelvic Tilt Limitations 5 sec hold x 20 reps    Lumbar Exercises: Aerobic   Stationary Bike 5 minutes on level 1 at beginning of PT tx    Lumbar Exercises: Standing   Functional Squats Other (comment);20 reps   Lumbar Exercises: Seated   Hip Flexion on Ball Both;10 reps   Hip Flexion on Ball Limitations Marching 5" holds with ab set on Dynadisc   Lumbar Exercises: Supine           Clam 20 reps;1 second   Clam Limitations supine with ab set and red theraband, bilaterally   Large Ball Abdominal Isometric 15 reps;5 seconds   Large Ball Abdominal Isometric Limitations small green theraball 5"  holds   Other Supine Lumbar Exercises Dead bug x 8 reps   Manual Therapy   Manual Therapy Muscle Energy Technique;Other (comment);Joint mobilization   Joint Mobilization R SIJ posterior mobs, using grade I-III in L S/L position    Passive ROM B hip PROM into flexion and extension    Muscle Energy Technique Resisted R hip ext and L hip flexion                PT Education - 09/11/15 1310    Education provided Yes   Education Details Educated the pt on optimal sitting posture with use of lumbar roll, HEP, and use of moist heat pack(15-20, 3-4x/day, use of small towel to protect skin)   Person(s) Educated Patient   Methods Explanation;Demonstration   Comprehension Verbalized understanding;Returned demonstration;Need further instruction          PT Short Term Goals - 08/30/15 1402    PT SHORT TERM GOAL #1   Title Patient will be able to maintain correct posture at least 75% of the time during all functional siutations and scenarios in order to reduce pain and improve balance    Time 3   Period Weeks   Status On-going   PT SHORT TERM GOAL #2   Title Patient will experience no more than 4/10 pain with all functional tasks, transfers, and activities in order to enhance patient's ability to participate in tasks at home and in community    Time 3   Period Weeks   Status On-going   PT SHORT TERM GOAL #3   Title Patient will be able to ambulate at least 1266f during 6 minute walk test in order to improve overall mobility and assist in return to community based tasks    Time 3   Period Weeks   Status On-going   PT SHORT TERM GOAL #4   Title Patient to be independent in correctly and consistently performing appropriate HEP, to be updated PRN    Time 3   Period Weeks   Status On-going           PT Long Term Goals - 08/30/15 1403    PT LONG TERM GOAL #1   Title Patient will demonstrate strength 4+/5 in all tested muscle groups in order to enhance regional stability and  reduce pain    Time 6   Period Weeks   Status On-going   PT LONG TERM GOAL #2   Title Patient will experience no more than 2/10 during all functional tasks, transfers, and activities in order to enhance overall function and assist in return to community tasks  Time 6   Period Weeks   Status On-going   PT LONG TERM GOAL #3   Title Patient to report she has been able to return to community based activities on a regular basis in order to enhance overall function and quality of life    Time 6   Period Weeks   Status On-going   PT LONG TERM GOAL #4   Title Patient to have initiated participation in water based exercise program at least 2-3 times per week in order to assist in maintaining functional gains and managing back pain    Time 6   Period Weeks   Status On-going               Plan - 09/11/15 1313    Clinical Impression Statement Reviewed and completed current ther ex regimen with addition of supine dead bug ther ex to improve core stabilization. Occasional cues were required for proper core activation with progressed ther ex. R anterior rotated innominate assessed in supine. Added SIJ posterior jt mobs in order to reduce anterior rotation. Pelvic alignment returned to WNL with completion of  MET to R and L LE and R SIJ mobs as documented. Pt tolerated tx well with LBP rated a 0/10 on a VAS at the completion of today's PT tx. Pt is responding well to current PT tx and would benefit from continued skilled PT.    Pt will benefit from skilled therapeutic intervention in order to improve on the following deficits Decreased activity tolerance;Decreased strength;Pain;Decreased mobility;Improper body mechanics;Impaired flexibility;Postural dysfunction   Rehab Potential Good   Clinical Impairments Affecting Rehab Potential anterolisthesis   PT Frequency 2x / week   PT Duration 6 weeks   PT Treatment/Interventions ADLs/Self Care Home Management;Moist Heat;Gait training;Stair  training;Functional mobility training;Therapeutic exercise;Therapeutic activities;Balance training;Neuromuscular re-education;Patient/family education;Passive range of motion;Manual techniques   PT Next Visit Plan Next visit to focus on supine and seated progressed Tr. Abdominis strengthening, quad/hip flexor stretch, SKTC/DKTC, and manual therapy techniques to address rotated innominate. Avoid extension- based movements    PT Home Exercise Plan HEP reviewed with addition of supine Tr. Ab with alternating hip and shoulder flexion   Consulted and Agree with Plan of Care Patient        Problem List There are no active problems to display for this patient.   Garen Lah, PT, DPT    09/11/2015, 1:43 PM  [place], [place], [address] Phone: [phone]   Fax:  [phone]  Name: Christine Conner MRN: [id number] Date of Birth: [birth year]

## 2015-09-13 ENCOUNTER — Ambulatory Visit (HOSPITAL_COMMUNITY): Payer: Medicare HMO | Attending: Internal Medicine | Admitting: Physical Therapy

## 2015-09-13 DIAGNOSIS — M545 Low back pain: Secondary | ICD-10-CM

## 2015-09-13 DIAGNOSIS — M6281 Muscle weakness (generalized): Secondary | ICD-10-CM | POA: Diagnosis not present

## 2015-09-13 DIAGNOSIS — R262 Difficulty in walking, not elsewhere classified: Secondary | ICD-10-CM | POA: Diagnosis not present

## 2015-09-13 DIAGNOSIS — R293 Abnormal posture: Secondary | ICD-10-CM | POA: Diagnosis not present

## 2015-09-13 NOTE — Therapy (Signed)
[place], [place], [address] Phone: [phone]   Fax:  [phone]  Physical Therapy Treatment  Patient Details  Name: Christine Conner MRN: [id number] Date of Birth: 1930-07-19 Referring Provider: Asencion Noble MD   Encounter Date: 09/13/2015      PT End of Session - 09/13/15 1738    Visit Number 8   Number of Visits 12   Date for PT Re-Evaluation 09/17/15   Authorization Type [employer] HMO    Authorization Time Period 08/20/15 to 10/18/15   Authorization - Visit Number 8   Authorization - Number of Visits 10   PT Start Time 1287   PT Stop Time 1558   PT Time Calculation (min) 40 min   Activity Tolerance Patient tolerated treatment well;No increased pain   Behavior During Therapy [facility] for tasks assessed/performed      Past Medical History  Diagnosis Date  . Medical history non-contributory     Past Surgical History  Procedure Laterality Date  . Breast cyst excision Right   . Cataract extraction w/phaco Right 05/02/2014    Procedure: CATARACT EXTRACTION PHACO AND INTRAOCULAR LENS PLACEMENT (IOC);  Surgeon: Elta Guadeloupe T. Gershon Crane, MD;  Location: [facility];  Service: Ophthalmology;  Laterality: Right;  CDE:8.79  . Cataract extraction w/phaco Left 05/23/2014    Procedure: CATARACT EXTRACTION PHACO AND INTRAOCULAR LENS PLACEMENT (IOC);  Surgeon: Elta Guadeloupe T. Gershon Crane, MD;  Location: [facility];  Service: Ophthalmology;  Laterality: Left;  CDE 9.01    There were no vitals filed for this visit.  Visit Diagnosis:  Low back pain, unspecified back pain laterality, with sciatica presence unspecified  Difficulty walking  Muscle weakness  Poor posture      Subjective Assessment - 09/13/15 1519    Subjective Patient arrived with reports of no pain today, but she does state that she is having trouble with pains in the morning and when she first gets up, as well as when she stands up from sitting for awhile    Pertinent History  Patient reports she cannot remember when it started exactly, maybe 2-3 months ago, but that she has quite a bit of pain when getting out of bed and walking. Quite a bit of pain when she is extending up from a bent position. Pain throughtout the day.    Currently in Pain? No/denies                         [facility] Adult PT Treatment/Exercise - 09/13/15 0001    Lumbar Exercises: Stretches   Active Hamstring Stretch 3 reps;30 seconds   Active Hamstring Stretch Limitations supine with rope    Single Knee to Chest Stretch 3 reps;30 seconds   Single Knee to Chest Stretch Limitations B    Double Knee to Chest Stretch 3 reps;30 seconds   Lower Trunk Rotation 5 reps;10 seconds   Lumbar Exercises: Standing   Functional Squats 10 reps   Functional Squats Limitations cues for form    Forward Lunge 10 reps   Forward Lunge Limitations 6in step   Other Standing Lumbar Exercises sit to stands with ab set 1x5    Other Standing Lumbar Exercises hip abduction    Lumbar Exercises: Supine   Clam 20 reps;1 second   Clam Limitations supine with ab set and red theraband, bilaterally   Other Supine Lumbar Exercises alternate/opposite UE/LE flexion with 1 second hold, ab set 1x10  PT Education - 09/13/15 1738    Education provided No          PT Short Term Goals - 08/30/15 1402    PT SHORT TERM GOAL #1   Title Patient will be able to maintain correct posture at least 75% of the time during all functional siutations and scenarios in order to reduce pain and improve balance    Time 3   Period Weeks   Status On-going   PT SHORT TERM GOAL #2   Title Patient will experience no more than 4/10 pain with all functional tasks, transfers, and activities in order to enhance patient's ability to participate in tasks at home and in community    Time 3   Period Weeks   Status On-going   PT SHORT TERM GOAL #3   Title Patient will be able to ambulate at least 1260f during 6  minute walk test in order to improve overall mobility and assist in return to community based tasks    Time 3   Period Weeks   Status On-going   PT SHORT TERM GOAL #4   Title Patient to be independent in correctly and consistently performing appropriate HEP, to be updated PRN    Time 3   Period Weeks   Status On-going           PT Long Term Goals - 08/30/15 1403    PT LONG TERM GOAL #1   Title Patient will demonstrate strength 4+/5 in all tested muscle groups in order to enhance regional stability and reduce pain    Time 6   Period Weeks   Status On-going   PT LONG TERM GOAL #2   Title Patient will experience no more than 2/10 during all functional tasks, transfers, and activities in order to enhance overall function and assist in return to community tasks    Time 6   Period Weeks   Status On-going   PT LONG TERM GOAL #3   Title Patient to report she has been able to return to community based activities on a regular basis in order to enhance overall function and quality of life    Time 6   Period Weeks   Status On-going   PT LONG TERM GOAL #4   Title Patient to have initiated participation in water based exercise program at least 2-3 times per week in order to assist in maintaining functional gains and managing back pain    Time 6   Period Weeks   Status On-going               Plan - 09/13/15 1739    Clinical Impression Statement Continued functional exercises today with focus on proximal muscle strengthening today; checked innominate alignment and patient actually appeared to be reasonablly well aligned this session so did not perform manual or MET to this area today. Finsihed session with weight baering exercises and patient did rquire intermittent cues for form. No increased pain at end of session.    Pt will benefit from skilled therapeutic intervention in order to improve on the following deficits Decreased activity tolerance;Decreased strength;Pain;Decreased  mobility;Improper body mechanics;Impaired flexibility;Postural dysfunction   Rehab Potential Good   Clinical Impairments Affecting Rehab Potential anterolisthesis   PT Frequency 2x / week   PT Duration 6 weeks   PT Treatment/Interventions ADLs/Self Care Home Management;Moist Heat;Gait training;Stair training;Functional mobility training;Therapeutic exercise;Therapeutic activities;Balance training;Neuromuscular re-education;Patient/family education;Passive range of motion;Manual techniques   PT Next Visit Plan Next visit to focus  on supine and seated progressed Tr. Abdominis strengthening, quad/hip flexor stretch, SKTC/DKTC, and manual therapy techniques to address rotated innominate. Avoid extension- based movements    PT Home Exercise Plan HEP reviewed with addition of supine Tr. Ab with alternating hip and shoulder flexion   Consulted and Agree with Plan of Care Patient        Problem List There are no active problems to display for this patient.   Deniece Ree PT, DPT [id number]  [place] [address] [place], [place], [address] Phone: [phone]   Fax:  [phone]  Name: Christine Conner MRN: [id number] Date of Birth: Feb 04, 1930

## 2015-09-18 ENCOUNTER — Ambulatory Visit (HOSPITAL_COMMUNITY): Payer: Medicare HMO | Admitting: Physical Therapy

## 2015-09-18 DIAGNOSIS — R262 Difficulty in walking, not elsewhere classified: Secondary | ICD-10-CM

## 2015-09-18 DIAGNOSIS — M545 Low back pain: Secondary | ICD-10-CM

## 2015-09-18 DIAGNOSIS — M6281 Muscle weakness (generalized): Secondary | ICD-10-CM

## 2015-09-18 DIAGNOSIS — R293 Abnormal posture: Secondary | ICD-10-CM

## 2015-09-18 NOTE — Therapy (Signed)
[facility] [place], [place], [address] Phone: [phone]   Fax:  [phone]  Physical Therapy Treatment (Re-Assessment)  Patient Details  Name: Christine Conner MRN: [id number] Date of Birth: 23-Jan-1930 Referring Provider: Asencion Noble MD   Encounter Date: 09/18/2015      PT End of Session - 09/18/15 0845    Visit Number 9   Number of Visits 15   Date for PT Re-Evaluation 10/16/15   Authorization Type [employer]    Authorization Time Period 08/20/15 to 10/18/15   Authorization - Visit Number 9   Authorization - Number of Visits 19   PT Start Time 0803   PT Stop Time 0843   PT Time Calculation (min) 40 min   Activity Tolerance Patient tolerated treatment well   Behavior During Therapy [facility] for tasks assessed/performed      Past Medical History  Diagnosis Date  . Medical history non-contributory     Past Surgical History  Procedure Laterality Date  . Breast cyst excision Right   . Cataract extraction w/phaco Right 05/02/2014    Procedure: CATARACT EXTRACTION PHACO AND INTRAOCULAR LENS PLACEMENT (IOC);  Surgeon: Elta Guadeloupe T. Gershon Crane, MD;  Location: [facility];  Service: Ophthalmology;  Laterality: Right;  CDE:8.79  . Cataract extraction w/phaco Left 05/23/2014    Procedure: CATARACT EXTRACTION PHACO AND INTRAOCULAR LENS PLACEMENT (IOC);  Surgeon: Elta Guadeloupe T. Gershon Crane, MD;  Location: [facility];  Service: Ophthalmology;  Laterality: Left;  CDE 9.01    There were no vitals filed for this visit.  Visit Diagnosis:  Low back pain, unspecified back pain laterality, with sciatica presence unspecified  Difficulty walking  Muscle weakness  Poor posture      Subjective Assessment - 09/18/15 0804    Subjective Patient reports taht she is feeling quite a bit better; she is still having pain in the mornings in her back, but feels that this is getting better too. Can bend over more easily, walking is also getting easier. Getting up and  down from sitting can be a bit hard for her still.    Pertinent History Patient reports she cannot remember when it started exactly, maybe 2-3 months ago, but that she has quite a bit of pain when getting out of bed and walking. Quite a bit of pain when she is extending up from a bent position. Pain throughtout the day.    How long can you sit comfortably? 2/7- 60 minutes    How long can you stand comfortably? 2/7- 60 minutes    How long can you walk comfortably? 2/7- no limits    Diagnostic tests N/A   Patient Stated Goals reduce LBP    Currently in Pain? Yes   Pain Score 2    Pain Location Back   Pain Orientation Lower;Right   Pain Descriptors / Indicators Aching;Dull  occasoinal sharpness    Pain Type Chronic pain   Pain Onset More than a month ago   Aggravating Factors  sitting still for a long period of time    Pain Relieving Factors moving around, advil    Multiple Pain Sites No            [facility] PT Assessment - 09/18/15 0001    AROM   Right Hip External Rotation  --  wfl    Right Hip Internal Rotation  22   Left Hip External Rotation  --  wfl    Left Hip Internal Rotation  25  Lumbar Flexion 83   Lumbar Extension 12   Lumbar - Right Side Bend fingertips past knee    Lumbar - Left Side Bend fingetrips past knee    Strength   Overall Strength Comments general core strength approx 2-3/5    Right Hip Flexion 3+/5   Right Hip Extension 3-/5   Right Hip ABduction 4-/5   Left Hip Flexion 3+/5   Left Hip Extension 3-/5   Left Hip ABduction 3+/5   Right Knee Flexion 4/5   Right Knee Extension 4+/5   Left Knee Flexion 4-/5   Left Knee Extension 4+/5   Right Ankle Dorsiflexion 5/5   Left Ankle Dorsiflexion 5/5   6 minute walk test results    Aerobic Endurance Distance Walked 1107   Endurance additional comments 6MWT; gait 1.71ms    High Level Balance   High Level Balance Comments TUG 8.9                      [facility] Adult PT Treatment/Exercise -  09/18/15 0001    Lumbar Exercises: Stretches   Active Hamstring Stretch 3 reps;30 seconds   Active Hamstring Stretch Limitations 12 inch box    Piriformis Stretch 3 reps;30 seconds   Piriformis Stretch Limitations seated   Lumbar Exercises: Standing   Heel Raises 15 reps   Heel Raises Limitations heel toe, cues for form    Other Standing Lumbar Exercises eccentric sit to stands 1x10 with 3 ssecond counts    Other Standing Lumbar Exercises posture work in sitting: scap retractions 1x15, backwards shoulder rolls 1x20                PT Education - 09/18/15 0845    Education provided Yes   Education Details progress, plan of care, updates to HEP    Person(s) Educated Patient   Methods Explanation;Demonstration;Handout   Comprehension Verbalized understanding;Returned demonstration          PT Short Term Goals - 09/18/15 0824    PT SHORT TERM GOAL #1   Title Patient will be able to maintain correct posture at least 75% of the time during all functional siutations and scenarios in order to reduce pain and improve balance    Baseline 2/7- patient reports that she feels like she is doing better    Time 3   Period Weeks   Status On-going   PT SHORT TERM GOAL #2   Title Patient will experience no more than 4/10 pain with all functional tasks, transfers, and activities in order to enhance patient's ability to participate in tasks at home and in community    Baseline 2/7- some days pain is there when she first gets up, sometimes 6/10 at worst, but does not hurt her the rest of the day    Time 3   Period Weeks   Status Partially Met   PT SHORT TERM GOAL #3   Title Patient will be able to ambulate at least [id number] 6 minute walk test in order to improve overall mobility and assist in return to community based tasks    Baseline 2/7- [id number]  Time 3   Period Weeks   Status On-going   PT SHORT TERM GOAL #4   Title Patient to be independent in correctly and consistently  performing appropriate HEP, to be updated PRN    Baseline 2/7- tries to do them every day, has occasoinal slips    Time 3   Period Weeks  Status Achieved           PT Long Term Goals - 2015/10/01 9924    PT LONG TERM GOAL #1   Title Patient will demonstrate strength 4+/5 in all tested muscle groups in order to enhance regional stability and reduce pain    Time 6   Period Weeks   Status On-going   PT LONG TERM GOAL #2   Title Patient will experience no more than 2/10 during all functional tasks, transfers, and activities in order to enhance overall function and assist in return to community tasks    Baseline September 30, 2022- no pain during most of day, but can have as much as 6/10 pain when first getting up    Time 6   Period Weeks   Status Partially Met   PT LONG TERM GOAL #3   Title Patient to report she has been able to return to community based activities on a regular basis in order to enhance overall function and quality of life    Baseline Sep 30, 2022- has not been out a lot    Time 6   Period Weeks   Status On-going   PT LONG TERM GOAL #4   Title Patient to have initiated participation in water based exercise program at least 2-3 times per week in order to assist in maintaining functional gains and managing back pain    Time 6   Period Weeks   Status On-going               Plan - 10-01-15 0830    Clinical Impression Statement Re-assessment performed today. Patient does show improvements in her strength, gait, and her balance, and reports she feels like she is doing much better overall; the only time she really has major pain is when she first gets up in the morning, otherwise able to the majority of what she'd like and what she needs to do. However she does report that she does feel weak at times, and can also have difficulty getting up from a chair. She reports that her symptoms in the morning feel like htey are going into her rear and a little bit down her R leg sometimes as well.  Recommend extension of skilled PT services in order to address remaining functional limitations with focus on functional strength, posture, and flexibility.    Pt will benefit from skilled therapeutic intervention in order to improve on the following deficits Decreased activity tolerance;Decreased strength;Pain;Decreased mobility;Improper body mechanics;Impaired flexibility;Postural dysfunction   Rehab Potential Good   Clinical Impairments Affecting Rehab Potential anterolisthesis   PT Frequency 2x / week   PT Duration 3 weeks   PT Treatment/Interventions ADLs/Self Care Home Management;Moist Heat;Gait training;Stair training;Functional mobility training;Therapeutic exercise;Therapeutic activities;Balance training;Neuromuscular re-education;Patient/family education;Passive range of motion;Manual techniques   PT Next Visit Plan Focus on posture, functional strength, and flexiblity of hips. Manual for rotated innominate. Avoid extension based movements.    PT Home Exercise Plan HEP reviewed with addition of supine Tr. Ab with alternating hip and shoulder flexion   Consulted and Agree with Plan of Care Patient          G-Codes - 01-Oct-2015 0846    Functional Assessment Tool Used Based on skilled clinical assessment of strength, gait, balance, pain, posture    Functional Limitation Mobility: Walking and moving around   Mobility: Walking and Moving Around Current Status ([id number]) At least 20 percent but less than 40 percent impaired, limited or restricted   Mobility: Walking and Moving Around Goal  Status [phone]) At least 20 percent but less than 40 percent impaired, limited or restricted      Problem List There are no active problems to display for this patient.  Physical Therapy Progress Note  Dates of Reporting Period: 08/20/15 to 09/18/15  Objective Reports of Subjective Statement: see above   Objective Measurements: see above   Goal Update: see above   Plan: see above   Reason Skilled  Services are Required: posture, functional strength, flexbility of hips, manual for rotated innominate, advanced HEP     Deniece Ree PT, DPT [place], [place], [address] Phone: [phone]   Fax:  [phone]  Name: Christine Conner MRN: [id number] Date of Birth: [birth year]

## 2015-09-18 NOTE — Patient Instructions (Signed)
   PIRIFORMIS AND HIP STRETCH - SEATED  While sitting in a chair, cross your affected leg on top of the other as shown.   Next, gently lean forward until a stretch is felt along the crossed leg.  Hold for 30 seconds, repeat twice each side, 2 times a day.

## 2015-09-26 ENCOUNTER — Ambulatory Visit (HOSPITAL_COMMUNITY): Payer: Medicare HMO | Admitting: Physical Therapy

## 2015-09-28 ENCOUNTER — Encounter (HOSPITAL_COMMUNITY): Payer: Medicare HMO

## 2015-10-02 ENCOUNTER — Encounter (HOSPITAL_COMMUNITY): Payer: Medicare HMO | Admitting: Physical Therapy

## 2015-10-04 ENCOUNTER — Encounter (HOSPITAL_COMMUNITY): Payer: Medicare HMO | Admitting: Physical Therapy

## 2015-11-12 ENCOUNTER — Ambulatory Visit (HOSPITAL_COMMUNITY)
Admission: RE | Admit: 2015-11-12 | Discharge: 2015-11-12 | Disposition: A | Discharge status: Home / Self Care | Payer: Medicare HMO | Source: Ambulatory Visit | Attending: Internal Medicine | Admitting: Internal Medicine

## 2015-11-12 ENCOUNTER — Other Ambulatory Visit (HOSPITAL_COMMUNITY): Payer: Self-pay | Admitting: Internal Medicine

## 2015-11-12 DIAGNOSIS — M898X2 Other specified disorders of bone, upper arm: Secondary | ICD-10-CM

## 2015-11-12 DIAGNOSIS — M79621 Pain in right upper arm: Secondary | ICD-10-CM | POA: Insufficient documentation

## 2015-11-12 DIAGNOSIS — M25521 Pain in right elbow: Secondary | ICD-10-CM | POA: Diagnosis not present

## 2015-11-20 DIAGNOSIS — R69 Illness, unspecified: Secondary | ICD-10-CM | POA: Diagnosis not present

## 2015-12-17 DIAGNOSIS — Z1231 Encounter for screening mammogram for malignant neoplasm of breast: Secondary | ICD-10-CM | POA: Diagnosis not present

## 2016-01-29 DIAGNOSIS — M25562 Pain in left knee: Secondary | ICD-10-CM | POA: Diagnosis not present

## 2016-01-29 DIAGNOSIS — M25462 Effusion, left knee: Secondary | ICD-10-CM | POA: Diagnosis not present

## 2016-04-10 ENCOUNTER — Encounter (HOSPITAL_COMMUNITY): Payer: Self-pay | Admitting: Physical Therapy

## 2016-04-10 NOTE — Therapy (Signed)
[facility] [address] [place], [place], [address] Phone: [phone]   Fax:  [phone]  Patient Details  Name: Christine Conner MRN: [id number] Date of Birth: 05/06/1930 Referring Provider:  No ref. provider found  Encounter Date: 04/10/2016   PHYSICAL THERAPY DISCHARGE SUMMARY  Visits from Start of Care: 9  Current functional level related to goals / functional outcomes: Patient has not returned since last skilled session    Remaining deficits: Unable to assess    Education / Equipment: N/A  Plan: Patient agrees to discharge.  Patient goals were partially met. Patient is being discharged due to not returning since the last visit.  ?????      Kristen Unger PT, DPT [phone]  [facility] [address] , [place], [address] Phone: [phone]   Fax:  [phone] 

## 2016-05-12 DIAGNOSIS — R69 Illness, unspecified: Secondary | ICD-10-CM | POA: Diagnosis not present

## 2016-05-21 ENCOUNTER — Ambulatory Visit (INDEPENDENT_AMBULATORY_CARE_PROVIDER_SITE_OTHER): Payer: Medicare HMO | Admitting: Orthopaedic Surgery

## 2016-05-21 DIAGNOSIS — M545 Low back pain: Secondary | ICD-10-CM | POA: Diagnosis not present

## 2016-05-21 DIAGNOSIS — M1712 Unilateral primary osteoarthritis, left knee: Secondary | ICD-10-CM

## 2016-05-22 ENCOUNTER — Other Ambulatory Visit (INDEPENDENT_AMBULATORY_CARE_PROVIDER_SITE_OTHER): Payer: Self-pay | Admitting: Orthopaedic Surgery

## 2016-05-22 DIAGNOSIS — M545 Low back pain, unspecified: Secondary | ICD-10-CM

## 2016-05-22 DIAGNOSIS — M79605 Pain in left leg: Secondary | ICD-10-CM

## 2016-05-27 ENCOUNTER — Ambulatory Visit (HOSPITAL_COMMUNITY)
Admission: RE | Admit: 2016-05-27 | Discharge: 2016-05-27 | Disposition: A | Discharge status: Home / Self Care | Payer: Medicare HMO | Source: Ambulatory Visit | Attending: Orthopaedic Surgery | Admitting: Orthopaedic Surgery

## 2016-05-27 DIAGNOSIS — M48061 Spinal stenosis, lumbar region without neurogenic claudication: Secondary | ICD-10-CM | POA: Diagnosis not present

## 2016-05-27 DIAGNOSIS — M545 Low back pain, unspecified: Secondary | ICD-10-CM

## 2016-05-27 DIAGNOSIS — M79605 Pain in left leg: Secondary | ICD-10-CM

## 2016-05-27 DIAGNOSIS — M2578 Osteophyte, vertebrae: Secondary | ICD-10-CM | POA: Insufficient documentation

## 2016-06-11 ENCOUNTER — Ambulatory Visit (INDEPENDENT_AMBULATORY_CARE_PROVIDER_SITE_OTHER): Payer: Medicare HMO | Admitting: Orthopedic Surgery

## 2016-06-13 ENCOUNTER — Other Ambulatory Visit (HOSPITAL_COMMUNITY): Payer: Self-pay | Admitting: Internal Medicine

## 2016-06-13 ENCOUNTER — Ambulatory Visit (HOSPITAL_COMMUNITY)
Admission: RE | Admit: 2016-06-13 | Discharge: 2016-06-13 | Disposition: A | Discharge status: Home / Self Care | Payer: Medicare HMO | Source: Ambulatory Visit | Attending: Internal Medicine | Admitting: Internal Medicine

## 2016-06-13 DIAGNOSIS — M25561 Pain in right knee: Secondary | ICD-10-CM | POA: Insufficient documentation

## 2016-06-13 DIAGNOSIS — M25461 Effusion, right knee: Secondary | ICD-10-CM | POA: Diagnosis not present

## 2016-06-13 DIAGNOSIS — M1711 Unilateral primary osteoarthritis, right knee: Secondary | ICD-10-CM | POA: Diagnosis not present

## 2016-06-18 ENCOUNTER — Encounter (INDEPENDENT_AMBULATORY_CARE_PROVIDER_SITE_OTHER): Payer: Self-pay | Admitting: Orthopaedic Surgery

## 2016-06-18 ENCOUNTER — Ambulatory Visit (INDEPENDENT_AMBULATORY_CARE_PROVIDER_SITE_OTHER): Payer: Medicare HMO | Admitting: Orthopaedic Surgery

## 2016-06-18 VITALS — BP 125/77 | HR 88 | Resp 14 | Ht 66.0 in | Wt 190.0 lb

## 2016-06-18 DIAGNOSIS — G8929 Other chronic pain: Secondary | ICD-10-CM

## 2016-06-18 DIAGNOSIS — M25561 Pain in right knee: Secondary | ICD-10-CM

## 2016-06-18 MED ORDER — METHYLPREDNISOLONE ACETATE 40 MG/ML IJ SUSP
80.0000 mg | INTRAMUSCULAR | Status: AC | PRN
  Administered 2016-06-18: 80 mg

## 2016-06-18 MED ORDER — LIDOCAINE HCL 1 % IJ SOLN
5.0000 mL | INTRAMUSCULAR | Status: AC | PRN
  Administered 2016-06-18: 5 mL

## 2016-06-18 NOTE — Progress Notes (Signed)
Pt states Left knee doing well, but the Right Knee hurts.  Xrays obtained at [facility] hospital.   MRI lumbar results to review  Pt getting into bed on 05/30/16 felt a "pop" then sharp pains in Right knee.  Could not walk day after, got better, then this week has really been hurting mainly on the inside of R knee. Large Joint Inj Date/Time: 06/18/2016 10:51 AM Performed by: Garald Balding Authorized by: Garald Balding   Consent Given by:  Patient Timeout: prior to procedure the correct patient, procedure, and site was verified   Indications:  Pain and joint swelling Location:  Knee Site:  R knee Prep: patient was prepped and draped in usual sterile fashion   Needle Size:  25 G Needle Length:  1.5 inches Approach:  Anteromedial Ultrasound Guidance: No   Fluoroscopic Guidance: No   Arthrogram: No Medications:  5 mL lidocaine 1 %; 80 mg methylPREDNISolone acetate 40 MG/ML Aspiration Attempted: No   Patient tolerance:  Patient tolerated the procedure well with no immediate complications   Christine Conner is here to the office today for follow-up evaluation of her back and bilateral knee pain. I injected her left knee with cortisone a month ago with an excellent result. The meantime Dr. Willey Blade is ordered films of her right knee which I have reviewed on the PACS system.  These reveal evidence of osteoarthritis predominantly in the medial compartment with subchondral sclerosis and peripheral osteophyte formation. There is a decrease in the medial joint space.  She also had an MRI scan of her lumbar spine performed 05/27/2016. This scan demonstrates multifactorial marked spinal stenosis and lateral recess stenosis at L3-4 and L4-5. I think she will benefit from epidural steroid injection and refer her for that procedure.  She has done well with a cortisone injection in her left knee I will plan to perform the same and her right knee. She has been approved for Visco supplementation as as an  option in that in the future. Exam today is a minimal right knee effusion with medial joint pain. His pain or lower extremity edema. He had full extension and flexion of her right knee compared to her left.  The leg raise was negative. There was no percussible tenderness of the lumbar spine.

## 2016-06-24 ENCOUNTER — Telehealth (INDEPENDENT_AMBULATORY_CARE_PROVIDER_SITE_OTHER): Payer: Self-pay | Admitting: Orthopaedic Surgery

## 2016-06-24 DIAGNOSIS — G894 Chronic pain syndrome: Secondary | ICD-10-CM

## 2016-06-24 NOTE — Telephone Encounter (Signed)
Please advise 

## 2016-06-24 NOTE — Telephone Encounter (Signed)
Patient states she had an knee injection last week at the [facility] office and she has not had any relief at all. Patient would like to know what the next step will be.

## 2016-06-27 NOTE — Telephone Encounter (Signed)
Schedule MRI of painful knee

## 2016-06-30 ENCOUNTER — Encounter (INDEPENDENT_AMBULATORY_CARE_PROVIDER_SITE_OTHER): Payer: Self-pay | Admitting: Physical Medicine and Rehabilitation

## 2016-06-30 ENCOUNTER — Ambulatory Visit (INDEPENDENT_AMBULATORY_CARE_PROVIDER_SITE_OTHER): Payer: Medicare HMO | Admitting: Physical Medicine and Rehabilitation

## 2016-06-30 VITALS — BP 154/86 | HR 94 | Temp 97.6°F

## 2016-06-30 DIAGNOSIS — M47816 Spondylosis without myelopathy or radiculopathy, lumbar region: Secondary | ICD-10-CM | POA: Diagnosis not present

## 2016-06-30 DIAGNOSIS — M48062 Spinal stenosis, lumbar region with neurogenic claudication: Secondary | ICD-10-CM

## 2016-06-30 MED ORDER — METHYLPREDNISOLONE ACETATE 80 MG/ML IJ SUSP
80.0000 mg | Freq: Once | INTRAMUSCULAR | Status: AC
  Administered 2016-06-30: 80 mg

## 2016-06-30 MED ORDER — LIDOCAINE HCL (PF) 1 % IJ SOLN: 0.3300 mL | Freq: Once | INTRAMUSCULAR | Status: AC

## 2016-06-30 NOTE — Procedures (Signed)
Lumbosacral Transforaminal Epidural Steroid Injection - Infraneural Approach with Fluoroscopic Guidance  Patient: Christine Conner      Date of Birth: 01-08-1930 MRN: [id number] PCP: Asencion Noble, MD      Visit Date: 06/30/2016   Universal Protocol:    Date/Time: 11/20/173:02 PM  Consent Given By: the patient  Position: PRONE   Additional Comments: Vital signs were monitored before and after the procedure. Patient was prepped and draped in the usual sterile fashion. The correct patient, procedure, and site was verified.   Injection Procedure Details:  Procedure Site One Meds Administered:  Meds ordered this encounter  Medications  . lidocaine (PF) (XYLOCAINE) 1 % injection 0.3 mL  . methylPREDNISolone acetate (DEPO-MEDROL) injection 80 mg      Laterality: Right  Location/Site:  L4-L5  Needle size: 22 G  Needle type: Spinal  Needle Placement: Transforaminal  Findings:  -Contrast Used: 1 mL iohexol 180 mg iodine/mL   -Comments: Excellent flow of contrast along the nerve and into the epidural space.  Procedure Details: After squaring off the end-plates of the desired vertebral level to get a true AP view, the C-arm was obliqued to the painful side so that the superior articulating process is positioned about 1/3 the length of the inferior endplate.  The needle was aimed toward the junction of the superior articular process and the transverse process of the inferior vertebrae. The needle's initial entry is in the lower third of the foramen through Kambin's triangle. The soft tissues overlying this target were infiltrated with 2-3 ml. of 1% Lidocaine without Epinephrine.  The spinal needle was then inserted and advanced toward the target using a "trajectory" view along the fluoroscope beam.  Under AP and lateral visualization, the needle was advanced so it did not puncture dura and did not traverse medially beyond the 6 o'clock position of the pedicle. Bi-planar projections were  used to confirm position. Aspiration was confirmed to be negative for CSF and/or blood. A 1-2 ml. volume of Isovue-250 was injected and flow of contrast was noted at each level. Radiographs were obtained for documentation purposes.   After attaining the desired flow of contrast documented above, a 0.5 to 1.0 ml test dose of 0.25% Marcaine was injected into each respective transforaminal space.  The patient was observed for 90 seconds post injection.  After no sensory deficits were reported, and normal lower extremity motor function was noted,   the above injectate was administered so that equal amounts of the injectate were placed at each foramen (level) into the transforaminal epidural space.   Additional Comments:  The patient tolerated the procedure well Dressing: Band-Aid    Post-procedure details: Patient was observed during the procedure. Post-procedure instructions were reviewed.  Patient left the clinic in stable condition.

## 2016-06-30 NOTE — Progress Notes (Signed)
Christine Conner - 82 y.o. female MRN [id number]  Date of birth: 1930-02-18  Office Visit Note: Visit Date: 06/30/2016 PCP: Asencion Noble, MD Referred by: Asencion Noble, MD  Subjective: Chief Complaint  Patient presents with  . Lower Back - Pain   HPI: Christine Conner is an 82 year old female very pleasant lady who comes in today with chronic worsening predominantly right-sided low back pain referral and the lateral hip. She also complains of right knee pain. She has been seeing Dr. Durward Fortes for some time for orthopedic care. Also complaining of right knee pain. Had injection in the knee 2 weeks ago with Dr. Durward Fortes which helped some.  She reports pain across lower back and worse on the right side for over a year. Gotten worse over time. Comes and goes. Grabbing pain with turning wrong way. Most of her pain is with extension and with standing going from flexion to extension. She at first that she gets some referral down the leg if she stands and walks but then is really unsure. Doesn't feel like she gets much in the feet. She's had no focal weakness. She has had some physical therapy in the past. She's had medication management to Dr. Durward Fortes without really any relief at all. MRI was obtained and is reviewed below. This did show multifactorial stenosis and facet arthropathy. She has not had a prior injections in the spine. She is present with her sister today who has had prior injections in spine surgery.      Review of Systems  Constitutional: Negative for chills, fever, malaise/fatigue and weight loss.  HENT: Negative for hearing loss and sinus pain.   Eyes: Negative for blurred vision, double vision and photophobia.  Respiratory: Negative for cough and shortness of breath.   Cardiovascular: Negative for chest pain, palpitations and leg swelling.  Gastrointestinal: Negative for abdominal pain, nausea and vomiting.  Genitourinary: Negative for flank pain.  Musculoskeletal: Negative for  myalgias.  Skin: Negative for itching and rash.  Neurological: Negative for tremors, focal weakness and weakness.  Endo/Heme/Allergies: Negative.   Psychiatric/Behavioral: Negative for depression. The patient is nervous/anxious.   All other systems reviewed and are negative.  Otherwise per HPI.  Assessment & Plan: Visit Diagnoses:  1. Spinal stenosis of lumbar region with neurogenic claudication   2. Spondylosis without myelopathy or radiculopathy, lumbar region     Plan: Findings:  Chronic worsening severe at times right more than left axial low back pain worse with standing extension and movement. She can reproduce some of the pain by pushing on that side and extending forward. Her pain seems to be facet mediated pain at least in the low back but she is getting referral to the lateral hip and the way she describes it does seem to be more nerve related just from a clinical standpoint. Obviously she did have some symptoms from both the stenosis and the facet arthritis. She does have 2 level pretty severe multifactorial stenosis. She is actually doing fairly well given the fact of our spine appears. She continues to do exercises when she can. I think at this point we should go ahead and complete a right L4 transforaminal epidural steroid injection diagnostically and hopefully therapeutically. His Theodoro Doing get through the holidays. If it's not very successful and would look at the facet joint block. Depending on these diagnostic injections obviously weak to go forward if it flared up on occasion with repeat injection. We could focus our efforts on focused therapy for her. Exercise wise she  would just benefit from general cardiovascular aerobic type exercise. We did complete the injection today to the symptoms. I spent more than 20 minutes speaking face-to-face with the patient with 50% of the time in counseling.    Meds & Orders:  Meds ordered this encounter  Medications  . lidocaine (PF) (XYLOCAINE)  1 % injection 0.3 mL  . methylPREDNISolone acetate (DEPO-MEDROL) injection 80 mg    Orders Placed This Encounter  Procedures  . Epidural Steroid injection    Follow-up: Return if symptoms worsen or fail to improve.   Procedures: No procedures performed  Lumbosacral Transforaminal Epidural Steroid Injection - Infraneural Approach with Fluoroscopic Guidance  Patient: Christine Conner      Date of Birth: 1929-12-02 MRN: [id number] PCP: Asencion Noble, MD      Visit Date: 06/30/2016   Universal Protocol:    Date/Time: 11/20/173:02 PM  Consent Given By: the patient  Position: PRONE   Additional Comments: Vital signs were monitored before and after the procedure. Patient was prepped and draped in the usual sterile fashion. The correct patient, procedure, and site was verified.   Injection Procedure Details:  Procedure Site One Meds Administered:  Meds ordered this encounter  Medications  . lidocaine (PF) (XYLOCAINE) 1 % injection 0.3 mL  . methylPREDNISolone acetate (DEPO-MEDROL) injection 80 mg      Laterality: Right  Location/Site:  L4-L5  Needle size: 22 G  Needle type: Spinal  Needle Placement: Transforaminal  Findings:  -Contrast Used: 1 mL iohexol 180 mg iodine/mL   -Comments: Excellent flow of contrast along the nerve and into the epidural space.  Procedure Details: After squaring off the end-plates of the desired vertebral level to get a true AP view, the C-arm was obliqued to the painful side so that the superior articulating process is positioned about 1/3 the length of the inferior endplate.  The needle was aimed toward the junction of the superior articular process and the transverse process of the inferior vertebrae. The needle's initial entry is in the lower third of the foramen through Kambin's triangle. The soft tissues overlying this target were infiltrated with 2-3 ml. of 1% Lidocaine without Epinephrine.  The spinal needle was then inserted and  advanced toward the target using a "trajectory" view along the fluoroscope beam.  Under AP and lateral visualization, the needle was advanced so it did not puncture dura and did not traverse medially beyond the 6 o'clock position of the pedicle. Bi-planar projections were used to confirm position. Aspiration was confirmed to be negative for CSF and/or blood. A 1-2 ml. volume of Isovue-250 was injected and flow of contrast was noted at each level. Radiographs were obtained for documentation purposes.   After attaining the desired flow of contrast documented above, a 0.5 to 1.0 ml test dose of 0.25% Marcaine was injected into each respective transforaminal space.  The patient was observed for 90 seconds post injection.  After no sensory deficits were reported, and normal lower extremity motor function was noted,   the above injectate was administered so that equal amounts of the injectate were placed at each foramen (level) into the transforaminal epidural space.   Additional Comments:  The patient tolerated the procedure well Dressing: Band-Aid    Post-procedure details: Patient was observed during the procedure. Post-procedure instructions were reviewed.  Patient left the clinic in stable condition.     Clinical History: 05/27/2016 L3-4 multifactorial marked spinal stenosis and lateral recess narrowing.  L4-5 multifactorial marked spinal stenosis and lateral recess stenosis (  maximal just below disc space).  T11-12 multifactorial mild to slightly moderate canal narrowing crowding but not compressing the cord.  She reports that she has never smoked. She has never used smokeless tobacco. No results for input(s): HGBA1C, LABURIC in the last 8760 hours.  Objective:  VS:  HT:    WT:   BMI:     BP:(!) 154/86  HR:94bpm  TEMP:97.6 F (36.4 C)(Oral)  RESP:97 % Physical Exam  Constitutional: She is oriented to person, place, and time. She appears well-developed and well-nourished.    Eyes: Conjunctivae and EOM are normal. Pupils are equal, round, and reactive to light.  Cardiovascular: Normal rate and intact distal pulses.   Pulmonary/Chest: Effort normal.  Musculoskeletal:  She is slow to rise from a seated position. She has concordant pain extension rotation to the right. She has no pain over the greater trochanters and no pain with hip rotation. Good distal strength without clonus.  Neurological: She is alert and oriented to person, place, and time.  Skin: Skin is warm and dry. No rash noted. No erythema.  Psychiatric: She has a normal mood and affect. Her behavior is normal.  Nursing note and vitals reviewed.   Ortho Exam Imaging: No results found.  Past Medical/Family/Surgical/Social History: Medications & Allergies reviewed per EMR There are no active problems to display for this patient.  Past Medical History:  Diagnosis Date  . Medical history non-contributory    History reviewed. No pertinent family history. Past Surgical History:  Procedure Laterality Date  . BREAST CYST EXCISION Right   . CATARACT EXTRACTION W/PHACO Right 05/02/2014   Procedure: CATARACT EXTRACTION PHACO AND INTRAOCULAR LENS PLACEMENT (IOC);  Surgeon: Elta Guadeloupe T. Gershon Crane, MD;  Location: [facility];  Service: Ophthalmology;  Laterality: Right;  CDE:8.79  . CATARACT EXTRACTION W/PHACO Left 05/23/2014   Procedure: CATARACT EXTRACTION PHACO AND INTRAOCULAR LENS PLACEMENT (IOC);  Surgeon: Elta Guadeloupe T. Gershon Crane, MD;  Location: [facility];  Service: Ophthalmology;  Laterality: Left;  CDE 9.01   Social History   Occupational History  . Not on file.   Social History Main Topics  . Smoking status: Never Smoker  . Smokeless tobacco: Never Used  . Alcohol use No  . Drug use: No  . Sexual activity: Yes    Birth control/ protection: Post-menopausal

## 2016-06-30 NOTE — Patient Instructions (Signed)

## 2016-07-01 NOTE — Telephone Encounter (Signed)
Scheduled MRI referral

## 2016-07-01 NOTE — Addendum Note (Signed)
Addended by: Mervyn Skeeters on: 07/01/2016 09:39 AM   Modules accepted: Orders

## 2016-07-01 NOTE — Telephone Encounter (Signed)
Sent MRI referral

## 2016-07-16 ENCOUNTER — Ambulatory Visit (INDEPENDENT_AMBULATORY_CARE_PROVIDER_SITE_OTHER): Payer: Medicare HMO | Admitting: Orthopaedic Surgery

## 2016-07-16 ENCOUNTER — Telehealth (INDEPENDENT_AMBULATORY_CARE_PROVIDER_SITE_OTHER): Payer: Self-pay

## 2016-07-16 ENCOUNTER — Encounter (INDEPENDENT_AMBULATORY_CARE_PROVIDER_SITE_OTHER): Payer: Self-pay | Admitting: Orthopaedic Surgery

## 2016-07-16 DIAGNOSIS — M25561 Pain in right knee: Secondary | ICD-10-CM | POA: Diagnosis not present

## 2016-07-16 DIAGNOSIS — M5441 Lumbago with sciatica, right side: Secondary | ICD-10-CM | POA: Diagnosis not present

## 2016-07-16 DIAGNOSIS — G8929 Other chronic pain: Secondary | ICD-10-CM | POA: Diagnosis not present

## 2016-07-16 DIAGNOSIS — M545 Low back pain, unspecified: Secondary | ICD-10-CM | POA: Insufficient documentation

## 2016-07-16 NOTE — Progress Notes (Signed)
   Office Visit Note   Patient: Christine Conner           Date of Birth: [birth year]           MRN: [id number] Visit Date: 07/16/2016              Requested by: Asencion Noble, MD [address] [place], [place] [address] PCP: Asencion Noble, MD   Assessment & Plan: Visit Diagnoses: Osteoarthritis right knee. Spinal stenosis of lumbar spine  Plan: Follow up as needed with either Dr. Ernestina Patches or myself.  Follow-Up Instructions: No Follow-up on file.   Orders:  No orders of the defined types were placed in this encounter.  No orders of the defined types were placed in this encounter.     Procedures: No procedures performed   Clinical Data: No additional findings.   Subjective: No chief complaint on file.   Pt very happy with the injection from Dr. Ernestina Patches. Her back does not hurt except when she gets up in the am. No more sharp pain. Pt is scheduled for an MRI of Right knee Thursday 07/17/16. She is wondering whether she should have it since the last cortisone shot in [place] helped her.  Christine Conner no longer is experiencing radiculopathy in either extremity. Cortisone injection made a big difference. She denies bowel or bladder dysfunction. She also was had an excellent result from the cortisone injection of her right knee. She has an MRI scan scheduled of the right knee for tomorrow believe we can cancel this  Review of Systems   Objective: Vital Signs: There were no vitals taken for this visit.  Physical Exam  Ortho Exam straight leg raise is negative bilaterally reflexes are symmetrical. Painless range of motion of both hips. No right knee effusion. No medial lateral knee joint pain. No instability.  Specialty Comments:  No specialty comments available.  Imaging: No results found.   PMFS History: There are no active problems to display for this patient.  Past Medical History:  Diagnosis Date  . Medical history non-contributory     No family history on file.    Past Surgical History:  Procedure Laterality Date  . BREAST CYST EXCISION Right   . CATARACT EXTRACTION W/PHACO Right 05/02/2014   Procedure: CATARACT EXTRACTION PHACO AND INTRAOCULAR LENS PLACEMENT (IOC);  Surgeon: Elta Guadeloupe T. Gershon Crane, MD;  Location: [facility];  Service: Ophthalmology;  Laterality: Right;  CDE:8.79  . CATARACT EXTRACTION W/PHACO Left 05/23/2014   Procedure: CATARACT EXTRACTION PHACO AND INTRAOCULAR LENS PLACEMENT (IOC);  Surgeon: Elta Guadeloupe T. Gershon Crane, MD;  Location: [facility];  Service: Ophthalmology;  Laterality: Left;  CDE 9.01   Social History   Occupational History  . Not on file.   Social History Main Topics  . Smoking status: Never Smoker  . Smokeless tobacco: Never Used  . Alcohol use No  . Drug use: No  . Sexual activity: Yes    Birth control/ protection: Post-menopausal

## 2016-07-16 NOTE — Telephone Encounter (Signed)
none

## 2016-07-17 ENCOUNTER — Ambulatory Visit (HOSPITAL_COMMUNITY): Payer: Medicare HMO | Attending: Orthopaedic Surgery

## 2016-08-12 DIAGNOSIS — E785 Hyperlipidemia, unspecified: Secondary | ICD-10-CM | POA: Diagnosis not present

## 2016-08-12 DIAGNOSIS — Z79899 Other long term (current) drug therapy: Secondary | ICD-10-CM | POA: Diagnosis not present

## 2016-08-12 DIAGNOSIS — R7301 Impaired fasting glucose: Secondary | ICD-10-CM | POA: Diagnosis not present

## 2016-08-12 DIAGNOSIS — M1990 Unspecified osteoarthritis, unspecified site: Secondary | ICD-10-CM | POA: Diagnosis not present

## 2016-08-18 DIAGNOSIS — M1990 Unspecified osteoarthritis, unspecified site: Secondary | ICD-10-CM | POA: Diagnosis not present

## 2016-08-18 DIAGNOSIS — Z0001 Encounter for general adult medical examination with abnormal findings: Secondary | ICD-10-CM | POA: Diagnosis not present

## 2016-08-18 DIAGNOSIS — R7301 Impaired fasting glucose: Secondary | ICD-10-CM | POA: Diagnosis not present

## 2016-08-18 DIAGNOSIS — Z6833 Body mass index (BMI) 33.0-33.9, adult: Secondary | ICD-10-CM | POA: Diagnosis not present

## 2016-11-19 DIAGNOSIS — R69 Illness, unspecified: Secondary | ICD-10-CM | POA: Diagnosis not present

## 2016-12-19 DIAGNOSIS — Z1231 Encounter for screening mammogram for malignant neoplasm of breast: Secondary | ICD-10-CM | POA: Diagnosis not present

## 2017-02-18 ENCOUNTER — Other Ambulatory Visit (HOSPITAL_COMMUNITY): Payer: Self-pay | Admitting: Internal Medicine

## 2017-02-18 ENCOUNTER — Ambulatory Visit (HOSPITAL_COMMUNITY)
Admission: RE | Admit: 2017-02-18 | Discharge: 2017-02-18 | Disposition: A | Discharge status: Home / Self Care | Payer: Medicare HMO | Source: Ambulatory Visit | Attending: Internal Medicine | Admitting: Internal Medicine

## 2017-02-18 DIAGNOSIS — M25511 Pain in right shoulder: Secondary | ICD-10-CM

## 2017-02-18 DIAGNOSIS — R079 Chest pain, unspecified: Secondary | ICD-10-CM | POA: Diagnosis not present

## 2017-02-18 DIAGNOSIS — R52 Pain, unspecified: Secondary | ICD-10-CM

## 2017-03-03 DIAGNOSIS — M79672 Pain in left foot: Secondary | ICD-10-CM | POA: Diagnosis not present

## 2017-03-03 DIAGNOSIS — M25579 Pain in unspecified ankle and joints of unspecified foot: Secondary | ICD-10-CM | POA: Diagnosis not present

## 2017-03-24 DIAGNOSIS — M79672 Pain in left foot: Secondary | ICD-10-CM | POA: Diagnosis not present

## 2017-03-24 DIAGNOSIS — M25579 Pain in unspecified ankle and joints of unspecified foot: Secondary | ICD-10-CM | POA: Diagnosis not present

## 2017-04-14 DIAGNOSIS — M79671 Pain in right foot: Secondary | ICD-10-CM | POA: Diagnosis not present

## 2017-04-14 DIAGNOSIS — M25579 Pain in unspecified ankle and joints of unspecified foot: Secondary | ICD-10-CM | POA: Diagnosis not present

## 2017-04-14 DIAGNOSIS — M79672 Pain in left foot: Secondary | ICD-10-CM | POA: Diagnosis not present

## 2017-05-15 DIAGNOSIS — R69 Illness, unspecified: Secondary | ICD-10-CM | POA: Diagnosis not present

## 2017-05-28 DIAGNOSIS — R69 Illness, unspecified: Secondary | ICD-10-CM | POA: Diagnosis not present

## 2017-06-09 DIAGNOSIS — M79672 Pain in left foot: Secondary | ICD-10-CM | POA: Diagnosis not present

## 2017-06-09 DIAGNOSIS — M25579 Pain in unspecified ankle and joints of unspecified foot: Secondary | ICD-10-CM | POA: Diagnosis not present

## 2017-06-10 IMAGING — DX DG HUMERUS 2V *R*
4 series · 4 of 4 positions shown · non-contrast
Comparison: None.

CLINICAL DATA: Midshaft right humeral pain for 2 weeks

EXAM:
RIGHT HUMERUS - 2+ VIEW

[humerus ap]
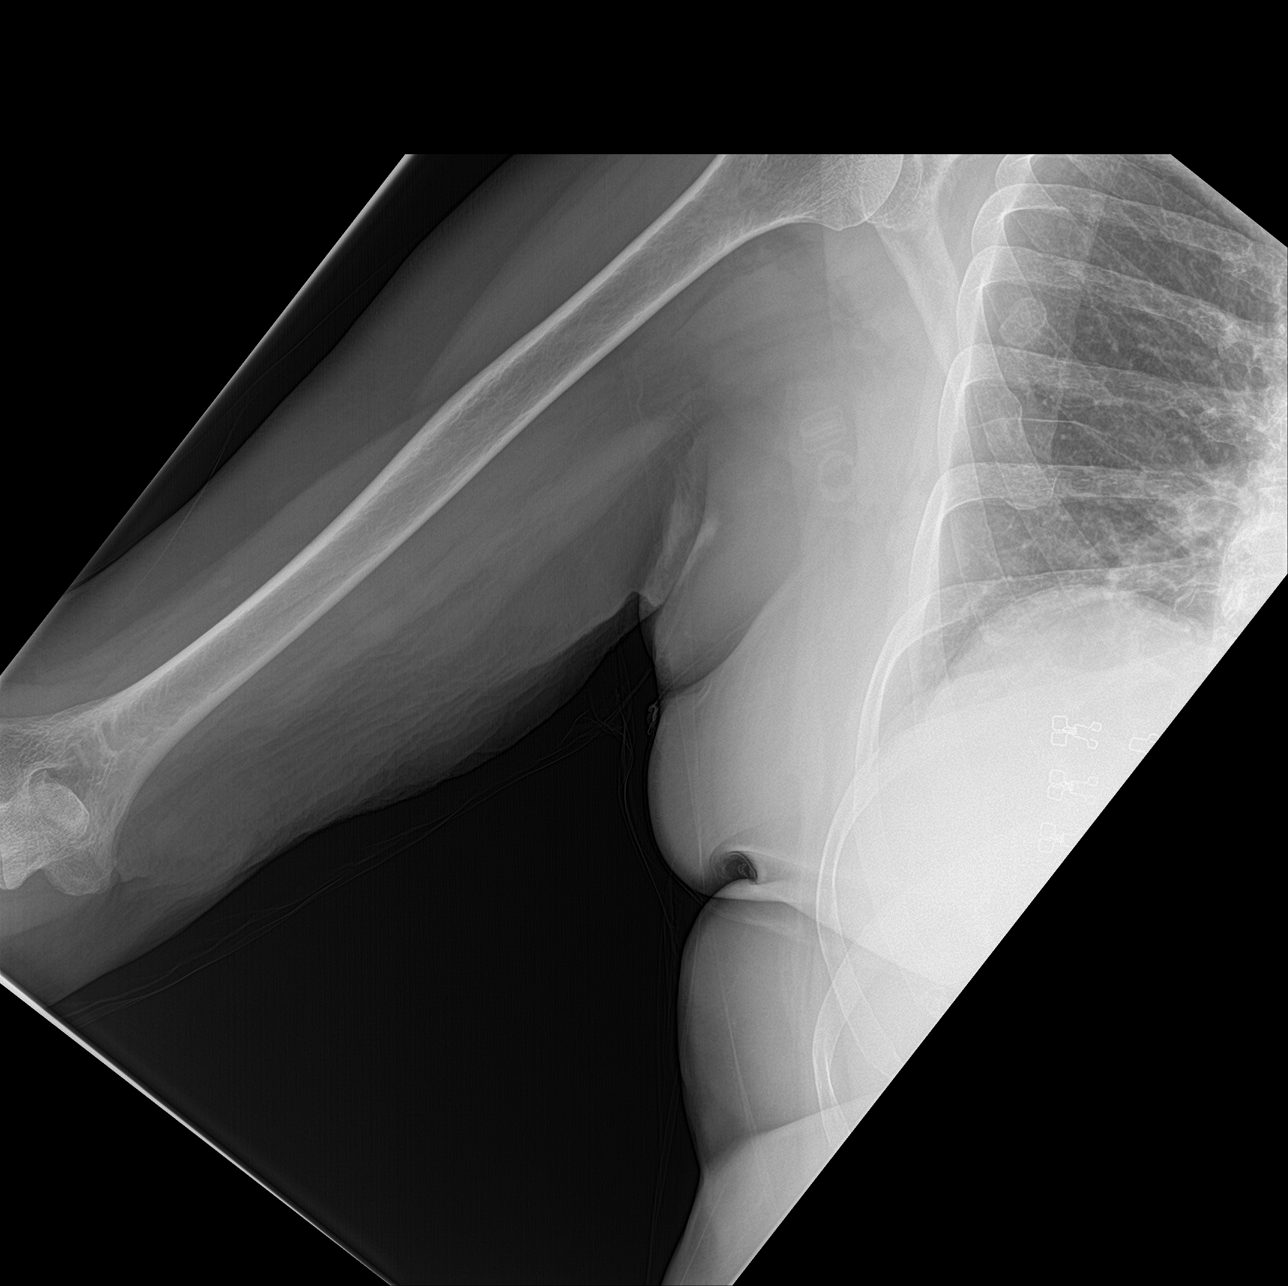

[humerus lat]
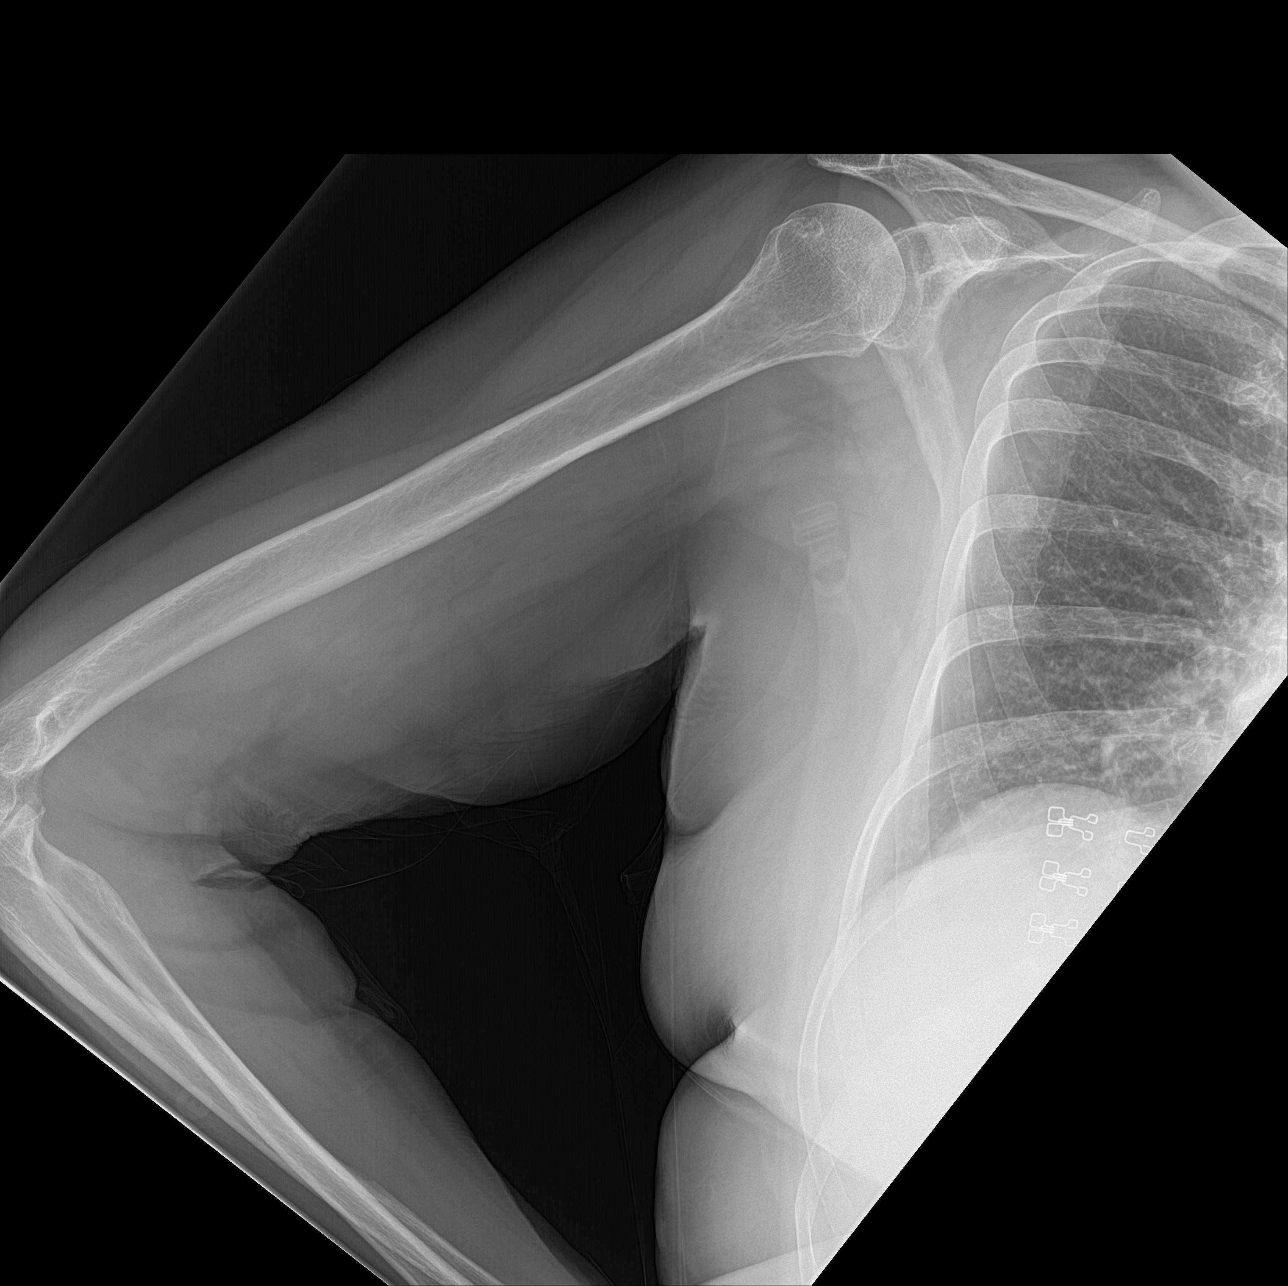

[elbow ap]
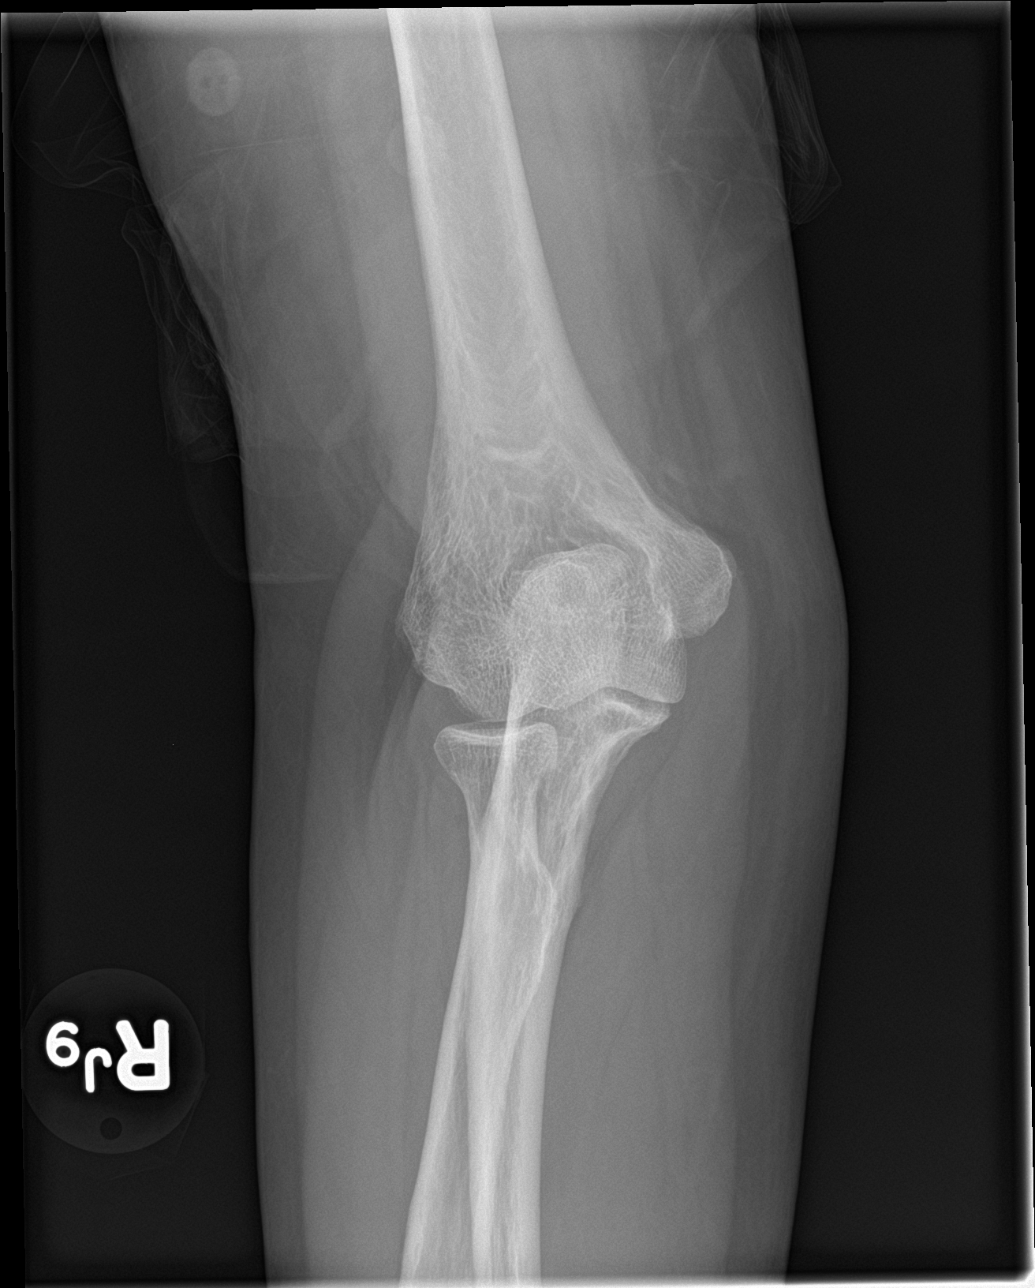

[elbow lat]
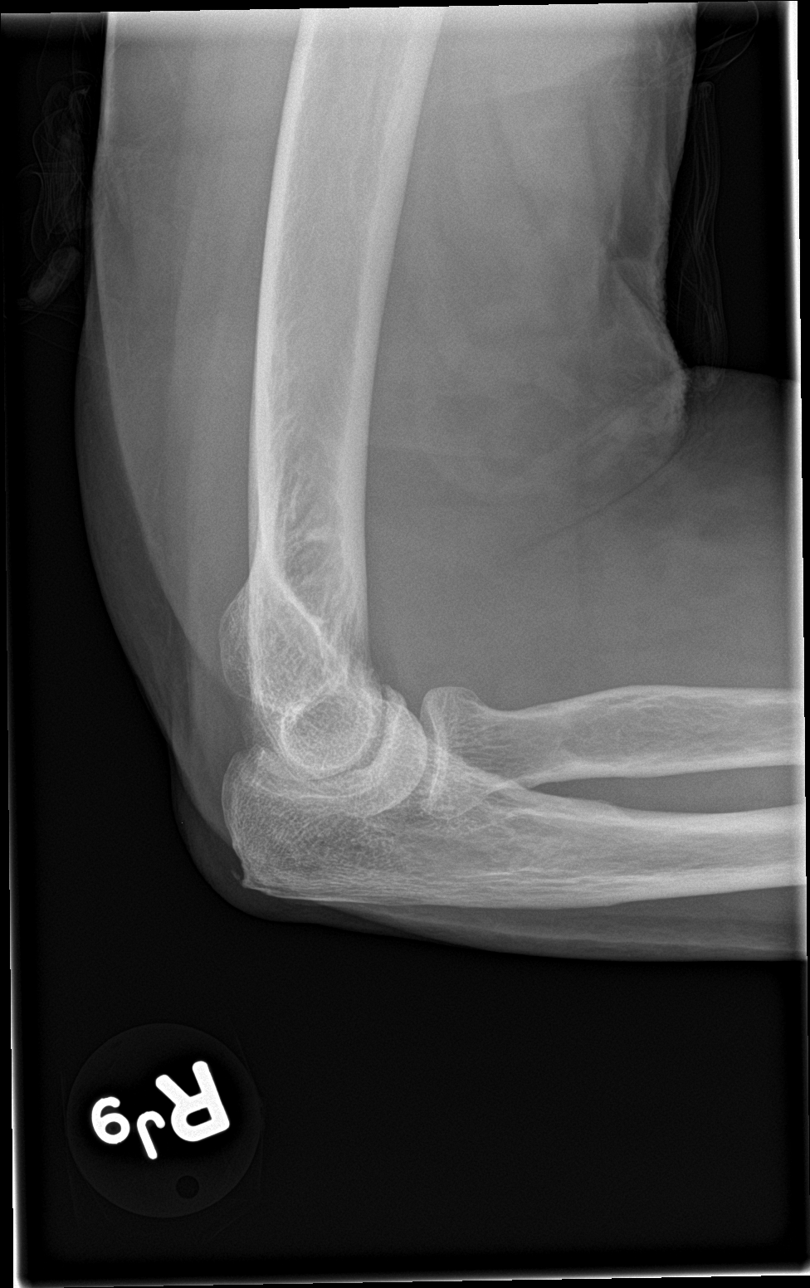

[4 of 4 positions shown; findings below may reference images not displayed]

FINDINGS: There is no evidence of fracture or other focal bone lesions. Soft
tissues are unremarkable.
IMPRESSION: No acute osseous injury of the right humerus.

## 2017-08-14 DIAGNOSIS — M1990 Unspecified osteoarthritis, unspecified site: Secondary | ICD-10-CM | POA: Diagnosis not present

## 2017-08-14 DIAGNOSIS — E785 Hyperlipidemia, unspecified: Secondary | ICD-10-CM | POA: Diagnosis not present

## 2017-08-14 DIAGNOSIS — R7301 Impaired fasting glucose: Secondary | ICD-10-CM | POA: Diagnosis not present

## 2017-08-21 DIAGNOSIS — I493 Ventricular premature depolarization: Secondary | ICD-10-CM | POA: Diagnosis not present

## 2017-08-21 DIAGNOSIS — R7301 Impaired fasting glucose: Secondary | ICD-10-CM | POA: Diagnosis not present

## 2017-08-21 DIAGNOSIS — Z0001 Encounter for general adult medical examination with abnormal findings: Secondary | ICD-10-CM | POA: Diagnosis not present

## 2017-08-21 DIAGNOSIS — Z6834 Body mass index (BMI) 34.0-34.9, adult: Secondary | ICD-10-CM | POA: Diagnosis not present

## 2017-12-21 DIAGNOSIS — Z1231 Encounter for screening mammogram for malignant neoplasm of breast: Secondary | ICD-10-CM | POA: Diagnosis not present

## 2017-12-30 ENCOUNTER — Encounter (INDEPENDENT_AMBULATORY_CARE_PROVIDER_SITE_OTHER): Payer: Self-pay | Admitting: Orthopaedic Surgery

## 2017-12-30 ENCOUNTER — Ambulatory Visit (INDEPENDENT_AMBULATORY_CARE_PROVIDER_SITE_OTHER): Payer: Medicare Other | Admitting: Orthopaedic Surgery

## 2017-12-30 VITALS — BP 138/85 | HR 98 | Resp 18 | Ht 66.0 in | Wt 190.0 lb

## 2017-12-30 DIAGNOSIS — G8929 Other chronic pain: Secondary | ICD-10-CM | POA: Diagnosis not present

## 2017-12-30 DIAGNOSIS — M5442 Lumbago with sciatica, left side: Secondary | ICD-10-CM

## 2017-12-30 DIAGNOSIS — M25562 Pain in left knee: Secondary | ICD-10-CM

## 2017-12-30 MED ORDER — LIDOCAINE HCL 1 % IJ SOLN
2.0000 mL | INTRAMUSCULAR | Status: AC | PRN
  Administered 2017-12-30: 2 mL

## 2017-12-30 MED ORDER — BUPIVACAINE HCL 0.5 % IJ SOLN
2.0000 mL | INTRAMUSCULAR | Status: AC | PRN
  Administered 2017-12-30: 2 mL via INTRA_ARTICULAR

## 2017-12-30 MED ORDER — METHYLPREDNISOLONE ACETATE 40 MG/ML IJ SUSP
80.0000 mg | INTRAMUSCULAR | Status: AC | PRN
Start: 2017-12-30 — End: 2017-12-30
  Administered 2017-12-30: 80 mg

## 2017-12-30 NOTE — Progress Notes (Signed)
Office Visit Note   Patient: Christine Conner           Date of Birth: 11-Feb-1930           MRN: [id number] Visit Date: 12/30/2017              Requested by: Asencion Noble, MD [address] [place], [place] [address] PCP: Asencion Noble, MD   Assessment & Plan: Visit Diagnoses:  1. Chronic midline low back pain with left-sided sciatica   2. Chronic pain of left knee     Plan: None injection left knee for the osteoarthritis.  Knee-high support stockings for the lower extremity swelling.  Consult Dr. Ernestina Patches for repeat epidural steroid injection.  Check with Dr. Willey Blade if left lower extremity swelling is not resolved with a stocking  Follow-Up Instructions: No follow-ups on file.   Orders:  Orders Placed This Encounter  Procedures  . Ambulatory referral to Physical Medicine Rehab   No orders of the defined types were placed in this encounter.     Procedures: Large Joint Inj: L knee on 12/30/2017 1:18 PM Indications: pain and diagnostic evaluation Details: 25 G 1.5 in needle, anteromedial approach  Arthrogram: No  Medications: 2 mL lidocaine 1 %; 2 mL bupivacaine 0.5 %; 80 mg methylPREDNISolone acetate 40 MG/ML Procedure, treatment alternatives, risks and benefits explained, specific risks discussed. Consent was given by the patient. Patient was prepped and draped in the usual sterile fashion.       Clinical Data: No additional findings.   Subjective: Chief Complaint  Patient presents with  . Lower Back - Pain  . Follow-up    recurrent back pain  Christine Conner is a 84 years old and visited the office for evaluation of recurrent low back pain associated with referred pain to her left lower extremity.  She did have an MRI scan in 2017 demonstrating multifactorial marked spinal stenosis and lateral recess narrowing at both L3-4 and L4-L5.  She has had prior epidural steroid injections with excellent relief.  She had repeat insidious onset without injury or trauma.  On  this occasion the pain does refer into her left lower extremity when she is up and about.  She also has developed some recurrent pain in her left knee.  She has had prior films demonstrating osteoarthritis in all 3 compartments.  Pain only is mostly along the medial aspect of her knee.  She is also had swelling of her left leg from her knee to her ankle but without calf pain.  HPI  Review of Systems  Constitutional: Negative for fatigue and fever.  HENT: Negative for ear pain.   Eyes: Negative for pain.  Respiratory: Positive for cough. Negative for shortness of breath.   Cardiovascular: Positive for leg swelling.  Gastrointestinal: Negative for constipation and diarrhea.  Genitourinary: Negative for difficulty urinating.  Musculoskeletal: Positive for back pain. Negative for neck pain.  Skin: Negative for rash.  Neurological: Positive for weakness and numbness.  Hematological: Bruises/bleeds easily.  Psychiatric/Behavioral: Negative for sleep disturbance.     Objective: Vital Signs: BP 138/85   Pulse 98   Resp 18   Ht 5\' 6"  (1.676 m)   Wt 190 lb (86.2 kg)   BMI 30.67 kg/m   Physical Exam  Constitutional: She is oriented to person, place, and time. She appears well-developed and well-nourished.  HENT:  Mouth/Throat: Oropharynx is clear and moist.  Eyes: Pupils are equal, round, and reactive to light. EOM are normal.  Pulmonary/Chest:  Effort normal.  Neurological: She is alert and oriented to person, place, and time.  Skin: Skin is warm and dry.  Psychiatric: She has a normal mood and affect. Her behavior is normal.    Ortho Exam awake alert and oriented x3.  Comfortable sitting.  Left knee with pain along the medial compartment mostly anterior.  Possibly very small effusion.  No popliteal pain.  No calf pain.  Multiple varicosities with pitting edema of her ankle .good pulses. Full extension left knee and flexion over 100 degrees.  No instability.  Straight leg raise  negative.  Minimal percussible tenderness of the lumbosacral junction.  No thigh swelling.  No groin pain. Specialty Comments:  No specialty comments available.  Imaging: No results found.   PMFS History: Patient Active Problem List   Diagnosis Date Noted  . Pain in right knee 07/16/2016  . Low back pain 07/16/2016   Past Medical History:  Diagnosis Date  . Medical history non-contributory     History reviewed. No pertinent family history.  Past Surgical History:  Procedure Laterality Date  . BREAST CYST EXCISION Right   . CATARACT EXTRACTION W/PHACO Right 05/02/2014   Procedure: CATARACT EXTRACTION PHACO AND INTRAOCULAR LENS PLACEMENT (IOC);  Surgeon: Elta Guadeloupe T. Gershon Crane, MD;  Location: [facility];  Service: Ophthalmology;  Laterality: Right;  CDE:8.79  . CATARACT EXTRACTION W/PHACO Left 05/23/2014   Procedure: CATARACT EXTRACTION PHACO AND INTRAOCULAR LENS PLACEMENT (IOC);  Surgeon: Elta Guadeloupe T. Gershon Crane, MD;  Location: [facility];  Service: Ophthalmology;  Laterality: Left;  CDE 9.01   Social History   Occupational History  . Not on file  Tobacco Use  . Smoking status: Never Smoker  . Smokeless tobacco: Never Used  Substance and Sexual Activity  . Alcohol use: No  . Drug use: No  . Sexual activity: Yes    Birth control/protection: Post-menopausal

## 2017-12-31 ENCOUNTER — Telehealth (INDEPENDENT_AMBULATORY_CARE_PROVIDER_SITE_OTHER): Payer: Self-pay | Admitting: *Deleted

## 2017-12-31 NOTE — Telephone Encounter (Signed)
Pt does not need pre [place] for [employer].

## 2018-01-25 ENCOUNTER — Ambulatory Visit (INDEPENDENT_AMBULATORY_CARE_PROVIDER_SITE_OTHER): Payer: Medicare Other

## 2018-01-25 ENCOUNTER — Encounter (INDEPENDENT_AMBULATORY_CARE_PROVIDER_SITE_OTHER): Payer: Self-pay | Admitting: Physical Medicine and Rehabilitation

## 2018-01-25 ENCOUNTER — Ambulatory Visit (INDEPENDENT_AMBULATORY_CARE_PROVIDER_SITE_OTHER): Payer: Medicare Other | Admitting: Physical Medicine and Rehabilitation

## 2018-01-25 VITALS — BP 138/85 | HR 74

## 2018-01-25 DIAGNOSIS — M48062 Spinal stenosis, lumbar region with neurogenic claudication: Secondary | ICD-10-CM | POA: Diagnosis not present

## 2018-01-25 DIAGNOSIS — M5416 Radiculopathy, lumbar region: Secondary | ICD-10-CM

## 2018-01-25 MED ORDER — BETAMETHASONE SOD PHOS & ACET 6 (3-3) MG/ML IJ SUSP
12.0000 mg | Freq: Once | INTRAMUSCULAR | Status: AC
  Administered 2018-01-25: 12 mg

## 2018-01-25 NOTE — Procedures (Signed)
Lumbosacral Transforaminal Epidural Steroid Injection - Sub-Pedicular Approach with Fluoroscopic Guidance  Patient: Christine Conner      Date of Birth: 23-Apr-1930 MRN: [id number] PCP: Asencion Noble, MD      Visit Date: 01/25/2018   Universal Protocol:    Date/Time: 01/25/2018  Consent Given By: the patient  Position: PRONE  Additional Comments: Vital signs were monitored before and after the procedure. Patient was prepped and draped in the usual sterile fashion. The correct patient, procedure, and site was verified.   Injection Procedure Details:  Procedure Site One Meds Administered:  Meds ordered this encounter  Medications  . betamethasone acetate-betamethasone sodium phosphate (CELESTONE) injection 12 mg    Laterality: Left  Location/Site:  L4-L5  Needle size: 22 G  Needle type: Spinal  Needle Placement: Transforaminal  Findings:    -Comments: Excellent flow of contrast along the nerve and into the epidural space.  Procedure Details: After squaring off the end-plates to get a true AP view, the C-arm was positioned so that an oblique view of the foramen as noted above was visualized. The target area is just inferior to the "nose of the scotty dog" or sub pedicular. The soft tissues overlying this structure were infiltrated with 2-3 ml. of 1% Lidocaine without Epinephrine.  The spinal needle was inserted toward the target using a "trajectory" view along the fluoroscope beam.  Under AP and lateral visualization, the needle was advanced so it did not puncture dura and was located close the 6 O'Clock position of the pedical in AP tracterory. Biplanar projections were used to confirm position. Aspiration was confirmed to be negative for CSF and/or blood. A 1-2 ml. volume of Isovue-250 was injected and flow of contrast was noted at each level. Radiographs were obtained for documentation purposes.   After attaining the desired flow of contrast documented above, a 0.5 to 1.0  ml test dose of 0.25% Marcaine was injected into each respective transforaminal space.  The patient was observed for 90 seconds post injection.  After no sensory deficits were reported, and normal lower extremity motor function was noted,   the above injectate was administered so that equal amounts of the injectate were placed at each foramen (level) into the transforaminal epidural space.   Additional Comments:  The patient tolerated the procedure well Dressing: Band-Aid    Post-procedure details: Patient was observed during the procedure. Post-procedure instructions were reviewed.  Patient left the clinic in stable condition.

## 2018-01-25 NOTE — Patient Instructions (Signed)

## 2018-01-25 NOTE — Progress Notes (Signed)
 .  Numeric Pain Rating Scale and Functional Assessment Average Pain 8   In the last MONTH (on 0-10 scale) has pain interfered with the following?  1. General activity like being  able to carry out your everyday physical activities such as walking, climbing stairs, carrying groceries, or moving a chair?  Rating(5)   +Driver, -BT, -Dye Allergies.  

## 2018-01-25 NOTE — Progress Notes (Signed)
Christine Conner - 84 y.o. female MRN [id number]  Date of birth: [birth year]  Office Visit Note: Visit Date: 01/25/2018 PCP: Asencion Noble, MD Referred by: Asencion Noble, MD  Subjective: Chief Complaint  Patient presents with  . Lower Back - Pain  . Left Leg - Pain   HPI: Mrs. Christine Conner is a very pleasant 84 year old female who is followed from orthopedic standpoint by Dr. Joni Fears.  She recently saw him on 12/30/2017 for what is left-sided hip and leg and back pain.  This is chronic worsening radicular type pain worse with standing and ambulating. She reports a lot of pain that goes into the buttocks and left leg.  We saw her in November 2017 and completed left L4 transforaminal epidural steroid injection with almost 90% relief of her symptoms.  At the time she had an MRI showing severe multifactorial stenosis at L3-4 and L4-5.  She reports that the pain started about a year ago.  She said no spinal surgery or other spine interventions.  Dr. Durward Fortes recently evaluated her and suggested another injection.  We talked with the patient today about numbers of injections and how there is no specific answer although there is no lifetime limit. We have to look at the patient's other medical conditions and how well the injection is working in for what length of time.  A lot of times with arbitrary number such as 3 injections over a standard time.  Is utilized but the reality is a system matter of seeing how well the person does.  We cannot do them frequently for a long term.  She is having left radicular pain likely from her stenosis.  We will complete a left L4 transforaminal injection again today.  She did talk about some feeling of weakness or giving way type symptoms in her feet.  On exam today she has good distal strength without any deficits or losses.  She has no pain with hip rotation.  She has good strength with knee flexion and extension.  **Just as an FYI, the patient did well when she first left  the office to get in the car seat she had difficulty getting her leg in the car and basically told my assistant that she had to lift her leg up into the car.  My assistant rightfully told her this was probably from the anesthetic.  The patient is also concerned because there was some pain involved as well.  My assistant rightfully told her that there is a volume of medication placed in such a small area that could be causing some discomfort.  She was somewhat apprehensive because she did not experience this the first time.  This can be a normal finding with stenosis and placing certain volume of medication to a tight space.  She was told to call us back over the next few days if there is any worsening and that she should actually probably feel much better over the next few hours.   ROS Otherwise per HPI.  Assessment & Plan: Visit Diagnoses:  1. Lumbar radiculopathy   2. Spinal stenosis of lumbar region with neurogenic claudication     Plan: No additional findings.   Meds & Orders:  Meds ordered this encounter  Medications  . betamethasone acetate-betamethasone sodium phosphate (CELESTONE) injection 12 mg    Orders Placed This Encounter  Procedures  . XR C-ARM NO REPORT  . Epidural Steroid injection    Follow-up: Return if symptoms worsen or fail to improve.  Procedures: No procedures performed  Lumbosacral Transforaminal Epidural Steroid Injection - Sub-Pedicular Approach with Fluoroscopic Guidance  Patient: Christine Conner      Date of Birth: [birth year] MRN: [id number] PCP: Asencion Noble, MD      Visit Date: 01/25/2018   Universal Protocol:    Date/Time: 01/25/2018  Consent Given By: the patient  Position: PRONE  Additional Comments: Vital signs were monitored before and after the procedure. Patient was prepped and draped in the usual sterile fashion. The correct patient, procedure, and site was verified.   Injection Procedure Details:  Procedure Site One Meds  Administered:  Meds ordered this encounter  Medications  . betamethasone acetate-betamethasone sodium phosphate (CELESTONE) injection 12 mg    Laterality: Left  Location/Site:  L4-L5  Needle size: 22 G  Needle type: Spinal  Needle Placement: Transforaminal  Findings:    -Comments: Excellent flow of contrast along the nerve and into the epidural space.  Procedure Details: After squaring off the end-plates to get a true AP view, the C-arm was positioned so that an oblique view of the foramen as noted above was visualized. The target area is just inferior to the "nose of the scotty dog" or sub pedicular. The soft tissues overlying this structure were infiltrated with 2-3 ml. of 1% Lidocaine without Epinephrine.  The spinal needle was inserted toward the target using a "trajectory" view along the fluoroscope beam.  Under AP and lateral visualization, the needle was advanced so it did not puncture dura and was located close the 6 O'Clock position of the pedical in AP tracterory. Biplanar projections were used to confirm position. Aspiration was confirmed to be negative for CSF and/or blood. A 1-2 ml. volume of Isovue-250 was injected and flow of contrast was noted at each level. Radiographs were obtained for documentation purposes.   After attaining the desired flow of contrast documented above, a 0.5 to 1.0 ml test dose of 0.25% Marcaine was injected into each respective transforaminal space.  The patient was observed for 90 seconds post injection.  After no sensory deficits were reported, and normal lower extremity motor function was noted,   the above injectate was administered so that equal amounts of the injectate were placed at each foramen (level) into the transforaminal epidural space.   Additional Comments:  The patient tolerated the procedure well Dressing: Band-Aid    Post-procedure details: Patient was observed during the procedure. Post-procedure instructions were  reviewed.  Patient left the clinic in stable condition.    Clinical History: 05/27/2016 L3-4 multifactorial marked spinal stenosis and lateral recess narrowing.  L4-5 multifactorial marked spinal stenosis and lateral recess stenosis (maximal just below disc space).  T11-12 multifactorial mild to slightly moderate canal narrowing crowding but not compressing the cord.   She reports that she has never smoked. She has never used smokeless tobacco. No results for input(s): HGBA1C, LABURIC in the last 8760 hours.  Objective:  VS:  HT:    WT:   BMI:     BP:138/85  HR:74bpm  TEMP: ( )  RESP:  Physical Exam  Constitutional: She is oriented to person, place, and time.  Musculoskeletal:  Patient ambulates without aid.  On exam she has good distal strength with dorsiflexion plantarflexion and EHL bilaterally without deficits.  She is wearing support hose on the left.  She had good pulses bilaterally.  She had good strength with knee flexion and extension.  Neurological: She is alert and oriented to person, place, and time. She exhibits normal muscle  tone. Coordination normal.    Ortho Exam Imaging: No results found.  Past Medical/Family/Surgical/Social History: Medications & Allergies reviewed per EMR, new medications updated. Patient Active Problem List   Diagnosis Date Noted  . Pain in right knee 07/16/2016  . Low back pain 07/16/2016   Past Medical History:  Diagnosis Date  . Medical history non-contributory    History reviewed. No pertinent family history. Past Surgical History:  Procedure Laterality Date  . BREAST CYST EXCISION Right   . CATARACT EXTRACTION W/PHACO Right 05/02/2014   Procedure: CATARACT EXTRACTION PHACO AND INTRAOCULAR LENS PLACEMENT (IOC);  Surgeon: Elta Guadeloupe T. Gershon Crane, MD;  Location: [facility];  Service: Ophthalmology;  Laterality: Right;  CDE:8.79  . CATARACT EXTRACTION W/PHACO Left 05/23/2014   Procedure: CATARACT EXTRACTION PHACO AND INTRAOCULAR LENS  PLACEMENT (IOC);  Surgeon: Elta Guadeloupe T. Gershon Crane, MD;  Location: [facility];  Service: Ophthalmology;  Laterality: Left;  CDE 9.01   Social History   Occupational History  . Not on file  Tobacco Use  . Smoking status: Never Smoker  . Smokeless tobacco: Never Used  Substance and Sexual Activity  . Alcohol use: No  . Drug use: No  . Sexual activity: Yes    Birth control/protection: Post-menopausal

## 2018-03-31 ENCOUNTER — Encounter (INDEPENDENT_AMBULATORY_CARE_PROVIDER_SITE_OTHER): Payer: Self-pay | Admitting: Orthopaedic Surgery

## 2018-03-31 ENCOUNTER — Ambulatory Visit (INDEPENDENT_AMBULATORY_CARE_PROVIDER_SITE_OTHER): Payer: Self-pay

## 2018-03-31 ENCOUNTER — Ambulatory Visit (INDEPENDENT_AMBULATORY_CARE_PROVIDER_SITE_OTHER): Payer: Medicare Other | Admitting: Orthopaedic Surgery

## 2018-03-31 VITALS — BP 132/86 | HR 82 | Ht 66.0 in | Wt 190.0 lb

## 2018-03-31 DIAGNOSIS — M25552 Pain in left hip: Secondary | ICD-10-CM | POA: Diagnosis not present

## 2018-03-31 NOTE — Progress Notes (Signed)
Office Visit Note   Patient: Christine Conner           Date of Birth: Dec 10, 1929           MRN: [id number] Visit Date: 03/31/2018              Requested by: Asencion Noble, MD [address] [place], [place] [address] PCP: Asencion Noble, MD   Assessment & Plan: Visit Diagnoses:  1. Pain in left hip     Plan: Mild osteoarthritis left hip.  Symptoms could be related to her hip arthritis or possibly referred from her back.  Her lumbosacral spine MRI scan revealed significant arthritis of multiple levels with several levels of lateral recess and central spinal stenosis.  Difficult for Christine Conner to distinguish between the hip and the back pain.  Presently asymptomatic.  Will monitor her course and discuss treatment based on her recurrent symptoms.  Would consider repeat epidural steroid injection versus left hip injection.  We will try over-the-counter medicines  Follow-Up Instructions: No follow-ups on file.   Orders:  Orders Placed This Encounter  Procedures  . XR HIP UNILAT W OR W/O PELVIS 2-3 VIEWS LEFT   No orders of the defined types were placed in this encounter.     Procedures: No procedures performed   Clinical Data: No additional findings.   Subjective: Chief Complaint  Patient presents with  . Follow-up    BACK PAIN AND LEFT HIP GROIN AND PAIN UNABLE TO GET UP AT TIMES, POPS WHEN TURNS THE WRONG WAY  Christine Conner is 84 years old and visited the office for evaluation of left hip pain.  She is had trouble for "at least 6 months.  There is no history of injury or trauma.  She has a catching in her groin especially when she twists or turns.  She has a history of chronic low back pain that is referred to her both buttocks.  She is had a prior MRI scan demonstrating multiple levels of the lumbar spine with arthritis and stenosis.  Presently asymptomatic. does not experience any numbness or tingling.  She is not experiencing any problem on the right  side  HPI  Review of Systems  Constitutional: Negative for fatigue and fever.  HENT: Negative for ear pain.   Eyes: Negative for pain.  Respiratory: Positive for shortness of breath.   Cardiovascular: Positive for leg swelling.  Gastrointestinal: Negative for constipation and diarrhea.  Genitourinary: Negative for difficulty urinating.  Musculoskeletal: Positive for back pain. Negative for neck pain.  Skin: Negative for rash.  Allergic/Immunologic: Negative for food allergies.  Neurological: Positive for weakness and numbness.  Hematological: Bruises/bleeds easily.  Psychiatric/Behavioral: Positive for sleep disturbance.     Objective: Vital Signs: BP 132/86 (BP Location: Right Arm, Patient Position: Sitting, Cuff Size: Normal)   Pulse 82   Ht 5\' 6"  (1.676 m)   Wt 190 lb (86.2 kg)   BMI 30.67 kg/m   Physical Exam  Constitutional: She is oriented to person, place, and time. She appears well-developed and well-nourished.  HENT:  Mouth/Throat: Oropharynx is clear and moist.  Eyes: Pupils are equal, round, and reactive to light. EOM are normal.  Pulmonary/Chest: Effort normal.  Neurological: She is alert and oriented to person, place, and time.  Skin: Skin is warm and dry.  Psychiatric: She has a normal mood and affect. Her behavior is normal.    Ortho Exam awake alert and oriented x3.  Comfortable sitting.  Painless range of  motion of the left hip with internal/external rotation.  There did not appear to be any loss of motion on the left compared to the right.  Straight leg raise is negative.  Does have unilateral edema of the left leg and not the right.  Has had support stockings in the past and does have venous insufficiency.  Left foot was warm.  Specialty Comments:  No specialty comments available.  Imaging: Xr Hip Unilat W Or W/o Pelvis 2-3 Views Left  Result Date: 03/31/2018 AP pelvis and lateral left hip were obtained without acute changes.  Mild osteoarthritis  with small osteophytes identified in the inferior aspect of the femoral head.  A few subchondral cysts in the femoral head as well.  The joint space appears to be well-maintained.    PMFS History: Patient Active Problem List   Diagnosis Date Noted  . Pain in right knee 07/16/2016  . Low back pain 07/16/2016   Past Medical History:  Diagnosis Date  . Medical history non-contributory     History reviewed. No pertinent family history.  Past Surgical History:  Procedure Laterality Date  . BREAST CYST EXCISION Right   . CATARACT EXTRACTION W/PHACO Right 05/02/2014   Procedure: CATARACT EXTRACTION PHACO AND INTRAOCULAR LENS PLACEMENT (IOC);  Surgeon: Elta Guadeloupe T. Gershon Crane, MD;  Location: [facility];  Service: Ophthalmology;  Laterality: Right;  CDE:8.79  . CATARACT EXTRACTION W/PHACO Left 05/23/2014   Procedure: CATARACT EXTRACTION PHACO AND INTRAOCULAR LENS PLACEMENT (IOC);  Surgeon: Elta Guadeloupe T. Gershon Crane, MD;  Location: [facility];  Service: Ophthalmology;  Laterality: Left;  CDE 9.01   Social History   Occupational History  . Not on file  Tobacco Use  . Smoking status: Never Smoker  . Smokeless tobacco: Never Used  Substance and Sexual Activity  . Alcohol use: No  . Drug use: No  . Sexual activity: Yes    Birth control/protection: Post-menopausal

## 2018-05-25 DIAGNOSIS — Z23 Encounter for immunization: Secondary | ICD-10-CM | POA: Diagnosis not present

## 2018-08-24 DIAGNOSIS — M1990 Unspecified osteoarthritis, unspecified site: Secondary | ICD-10-CM | POA: Diagnosis not present

## 2018-08-24 DIAGNOSIS — E785 Hyperlipidemia, unspecified: Secondary | ICD-10-CM | POA: Diagnosis not present

## 2018-08-24 DIAGNOSIS — Z79899 Other long term (current) drug therapy: Secondary | ICD-10-CM | POA: Diagnosis not present

## 2018-08-24 DIAGNOSIS — R7301 Impaired fasting glucose: Secondary | ICD-10-CM | POA: Diagnosis not present

## 2018-08-31 DIAGNOSIS — Z23 Encounter for immunization: Secondary | ICD-10-CM | POA: Diagnosis not present

## 2018-08-31 DIAGNOSIS — Z0001 Encounter for general adult medical examination with abnormal findings: Secondary | ICD-10-CM | POA: Diagnosis not present

## 2018-08-31 DIAGNOSIS — R7301 Impaired fasting glucose: Secondary | ICD-10-CM | POA: Diagnosis not present

## 2018-08-31 DIAGNOSIS — M1991 Primary osteoarthritis, unspecified site: Secondary | ICD-10-CM | POA: Diagnosis not present

## 2019-03-07 DIAGNOSIS — Z1231 Encounter for screening mammogram for malignant neoplasm of breast: Secondary | ICD-10-CM | POA: Diagnosis not present

## 2019-05-10 DIAGNOSIS — Z23 Encounter for immunization: Secondary | ICD-10-CM | POA: Diagnosis not present

## 2019-05-30 ENCOUNTER — Ambulatory Visit (INDEPENDENT_AMBULATORY_CARE_PROVIDER_SITE_OTHER): Payer: Medicare Other | Admitting: Physician Assistant

## 2019-05-30 ENCOUNTER — Other Ambulatory Visit: Payer: Self-pay

## 2019-05-30 ENCOUNTER — Encounter: Payer: Self-pay | Admitting: Physician Assistant

## 2019-05-30 ENCOUNTER — Other Ambulatory Visit (HOSPITAL_COMMUNITY): Payer: Self-pay | Admitting: Internal Medicine

## 2019-05-30 ENCOUNTER — Ambulatory Visit (HOSPITAL_COMMUNITY)
Admission: RE | Admit: 2019-05-30 | Discharge: 2019-05-30 | Disposition: A | Discharge status: Home / Self Care | Payer: Medicare Other | Source: Ambulatory Visit | Attending: Internal Medicine | Admitting: Internal Medicine

## 2019-05-30 DIAGNOSIS — M79672 Pain in left foot: Secondary | ICD-10-CM | POA: Diagnosis not present

## 2019-05-30 DIAGNOSIS — M21612 Bunion of left foot: Secondary | ICD-10-CM | POA: Insufficient documentation

## 2019-05-30 DIAGNOSIS — M7732 Calcaneal spur, left foot: Secondary | ICD-10-CM | POA: Insufficient documentation

## 2019-05-30 DIAGNOSIS — S8261XA Displaced fracture of lateral malleolus of right fibula, initial encounter for closed fracture: Secondary | ICD-10-CM

## 2019-05-30 DIAGNOSIS — W19XXXA Unspecified fall, initial encounter: Secondary | ICD-10-CM | POA: Insufficient documentation

## 2019-05-30 DIAGNOSIS — S92415A Nondisplaced fracture of proximal phalanx of left great toe, initial encounter for closed fracture: Secondary | ICD-10-CM | POA: Insufficient documentation

## 2019-05-30 DIAGNOSIS — M79674 Pain in right toe(s): Secondary | ICD-10-CM | POA: Diagnosis present

## 2019-05-30 DIAGNOSIS — M7731 Calcaneal spur, right foot: Secondary | ICD-10-CM | POA: Diagnosis not present

## 2019-05-30 NOTE — Progress Notes (Signed)
Office Visit Note   Patient: Christine Conner           Date of Birth: 1929-09-28           MRN: [id number] Visit Date: 05/30/2019              Requested by: Asencion Noble, MD [address] [place],  [place] [address] PCP: Asencion Noble, MD   Assessment & Plan: Visit Diagnoses:  1. Displaced fracture of lateral malleolus of right fibula, initial encounter for closed fracture     Plan:  We will place her in a cam walker boot touchdown weightbearing as tolerated.  She is to treat the boot like a cast.  Like to see her back on Thursday, October 29 that that time obtain 3 views of the right ankle.  Discussed with her treating this conservatively and in the cam walker boot instead of surgery.  However if this moves out of place she understands that we will have to treat this surgically.  She is encouraged to elevate both legs and we worked toes on the right foot often.  In regards to the left great toe and second toe base nondisplaced fractures,  buddy taping left great toe second toe and third toe together this is done for her today.  Follow-Up Instructions: Return in about 10 days (around 06/09/2019).   Orders:  No orders of the defined types were placed in this encounter.  No orders of the defined types were placed in this encounter.     Procedures: No procedures performed   Clinical Data: No additional findings.   Subjective: Chief Complaint  Patient presents with   Right Ankle - Fracture   Left Foot - Pain    HPI Christine Conner is an 85 year old female who had a mechanical fall on Friday.  She initially did not feel that she done any significant damage continue to walk on the right foot and ankle.  She felt that the ankle was the sprain.  However she saw her primary care physician today and he sent her for radiographs that showed a slightly displaced oblique fracture involving the right lateral malleolus.  Pain was well located within the ankle mortise no diastases  appreciated.  No other fractures identified.  3 views of the left foot show a base of the second great toe nondisplaced fracture in the left great toe intra-articular fracture also nondisplaced.  She has significant arthritic changes involving the first TMT joint and severe hallux valgus deformity.  Review of Systems No fevers chills shortness breath chest pain.  Objective: Vital Signs: There were no vitals taken for this visit.  Physical Exam Constitutional:      Appearance: She is not ill-appearing or diaphoretic.  Cardiovascular:     Pulses: Normal pulses.  Pulmonary:     Effort: Pulmonary effort is normal.  Neurological:     Mental Status: She is alert and oriented to person, place, and time.  Psychiatric:        Mood and Affect: Mood normal.     Ortho Exam Left leg significant bruising involving the first second and third toes.  She has a slight cock-up deformity of the second toe which appears chronic due to hallux valgus deformity.  She notes tenderness over the base of the second and the first proximal phalanx.  Remaining foot is nontender. Right ankle able dorsiflex plantarflex.  Slight edema globally about the right ankle compared to left.  She has slight tenderness over the  medial malleolus but maximal tenderness over the lateral malleolus.  No gross deformity.  Bilateral calves are supple.  She is nontender proximal tib-fib bilaterally. Specialty Comments:  No specialty comments available.  Imaging: Dg Ankle Complete Right  Result Date: 05/30/2019 CLINICAL DATA:  Accidental fall Friday afternoon twisting her ankle, pain swelling and bruising laterally EXAM: RIGHT ANKLE - COMPLETE 3+ VIEW COMPARISON:  None FINDINGS: Osseous demineralization. Oblique fracture of the distal RIGHT fibula, slightly displaced laterally. Small calcified ossicle at medial joint line. Additional small ossicle at tip of lateral malleolus, appears old. Diffuse soft tissue swelling. Moderate-sized  plantar calcaneal spur. No additional fracture, dislocation or bone destruction. Degenerative changes at the TMT joints. IMPRESSION: Displaced oblique fracture of the RIGHT lateral malleolus. Electronically Signed   By: Lavonia Dana M.D.   On: 05/30/2019 11:49   Dg Foot Complete Left  Addendum Date: 05/30/2019   ADDENDUM REPORT: 05/30/2019 12:20 ADDENDUM: Omitted from initial dictation is presence of a nondisplaced intra-articular fracture at the plantar aspect of the proximal phalanx LEFT great toe. Electronically Signed   By: Lavonia Dana M.D.   On: 05/30/2019 12:20   Result Date: 05/30/2019 CLINICAL DATA:  [place] on Friday, bent LEFT big toe underneath ir. Pain and bruising/black and blue coloration at great toe into foot EXAM: LEFT FOOT - COMPLETE 3+ VIEW COMPARISON:  None FINDINGS: Osseous demineralization. Advanced degenerative changes at second TMT joint. Question subtle nondisplaced fracture at base of proximal phalanx second toe. Significant hallux valgus with lateral subluxation of the proximal phalanx great toe. Degenerative changes of the first MTP joint with bunion deformity. Degenerative changes of the first TMT joint. No additional fracture, dislocation, or bone destruction. Moderate-sized plantar calcaneal spur. IMPRESSION: Calix valgus with significant bunion deformity and degenerative changes of the first MTP joint. Advanced degenerative changes at the second MTP joint with question subtle nondisplaced fracture at base of proximal phalanx second toe. Electronically Signed: By: Lavonia Dana M.D. On: 05/30/2019 11:52     PMFS History: Patient Active Problem List   Diagnosis Date Noted   Pain in right knee 07/16/2016   Low back pain 07/16/2016   Past Medical History:  Diagnosis Date   Medical history non-contributory     History reviewed. No pertinent family history.  Past Surgical History:  Procedure Laterality Date   BREAST CYST EXCISION Right    CATARACT EXTRACTION W/PHACO  Right 05/02/2014   Procedure: CATARACT EXTRACTION PHACO AND INTRAOCULAR LENS PLACEMENT (IOC);  Surgeon: Elta Guadeloupe T. Gershon Crane, MD;  Location: [facility];  Service: Ophthalmology;  Laterality: Right;  CDE:8.79   CATARACT EXTRACTION W/PHACO Left 05/23/2014   Procedure: CATARACT EXTRACTION PHACO AND INTRAOCULAR LENS PLACEMENT (IOC);  Surgeon: Elta Guadeloupe T. Gershon Crane, MD;  Location: [facility];  Service: Ophthalmology;  Laterality: Left;  CDE 9.01   Social History   Occupational History   Not on file  Tobacco Use   Smoking status: Never Smoker   Smokeless tobacco: Never Used  Substance and Sexual Activity   Alcohol use: No   Drug use: No   Sexual activity: Yes    Birth control/protection: Post-menopausal

## 2019-06-09 ENCOUNTER — Ambulatory Visit: Payer: Medicare Other | Admitting: Physician Assistant

## 2019-06-09 ENCOUNTER — Encounter: Payer: Self-pay | Admitting: Physician Assistant

## 2019-06-09 ENCOUNTER — Ambulatory Visit (INDEPENDENT_AMBULATORY_CARE_PROVIDER_SITE_OTHER): Payer: Medicare Other

## 2019-06-09 ENCOUNTER — Other Ambulatory Visit: Payer: Self-pay

## 2019-06-09 DIAGNOSIS — S8261XA Displaced fracture of lateral malleolus of right fibula, initial encounter for closed fracture: Secondary | ICD-10-CM

## 2019-06-09 NOTE — Progress Notes (Signed)
Office Visit Note   Patient: Christine Conner           Date of Birth: [birth year]           MRN: [id number] Visit Date: 06/09/2019              Requested by: Asencion Noble, MD [address] [place],  [place] [address] PCP: Asencion Noble, MD   Assessment & Plan: Visit Diagnoses:  1. Displaced fracture of lateral malleolus of right fibula, initial encounter for closed fracture     Plan: We will have her remain touchdown weightbearing in the cam walker boot for the next 2 weeks and then weightbearing as tolerated in the cam walker boot for an additional 2 weeks.  In 1 month we will have her follow-up with [place] and obtain 3 views of the right ankle.  Most likely at that point we will place her in an ASO brace.  Questions encouraged and answered at length.  Follow-Up Instructions: Return in about 4 weeks (around 07/07/2019) for Radiographs.   Orders:  Orders Placed This Encounter  Procedures  . XR Ankle Complete Right   No orders of the defined types were placed in this encounter.     Procedures: No procedures performed   Clinical Data: No additional findings.   Subjective: Chief Complaint  Patient presents with  . Right Ankle - Follow-up    HPI Christine Conner returns today 11 days status post right lateral malleolus fracture.  She is being treated and cam walker boot touchdown weightbearing.  She is overall doing well she is using a rolling walker to ambulate.  No shortness of breath fevers chills or chest pain. Review of Systems   Objective: Vital Signs: There were no vitals taken for this visit.  Physical Exam General: Well-developed well-nourished female no acute distress Ortho Exam Right ankle nontender of the Achilles.  Minimal tenderness over the medial malleolus.  Maximal tenderness over the lateral malleolus.  Nontender throughout the foot.  Dorsal pedal pulse 2+.  Significant ecchymosis about the ankle with edema.  No skin breakdown no impending ulcers.  Specialty Comments:  No specialty comments available.  Imaging: Xr Ankle Complete Right  Result Date: 06/09/2019 Right ankle 3 views: Talus remains well I located within the mortise without evidence of diastases.  Lateral malleolus fracture remains in acceptable position.  No significant signs of healing.    PMFS History: Patient Active Problem List   Diagnosis Date Noted  . Pain in right knee 07/16/2016  . Low back pain 07/16/2016   Past Medical History:  Diagnosis Date  . Medical history non-contributory     History reviewed. No pertinent family history.  Past Surgical History:  Procedure Laterality Date  . BREAST CYST EXCISION Right   . CATARACT EXTRACTION W/PHACO Right 05/02/2014   Procedure: CATARACT EXTRACTION PHACO AND INTRAOCULAR LENS PLACEMENT (IOC);  Surgeon: Elta Guadeloupe T. Gershon Crane, MD;  Location: [facility];  Service: Ophthalmology;  Laterality: Right;  CDE:8.79  . CATARACT EXTRACTION W/PHACO Left 05/23/2014   Procedure: CATARACT EXTRACTION PHACO AND INTRAOCULAR LENS PLACEMENT (IOC);  Surgeon: Elta Guadeloupe T. Gershon Crane, MD;  Location: [facility];  Service: Ophthalmology;  Laterality: Left;  CDE 9.01   Social History   Occupational History  . Not on file  Tobacco Use  . Smoking status: Never Smoker  . Smokeless tobacco: Never Used  Substance and Sexual Activity  . Alcohol use: No  . Drug use: No  . Sexual activity: Yes  Birth control/protection: Post-menopausal

## 2019-07-11 ENCOUNTER — Other Ambulatory Visit: Payer: Self-pay

## 2019-07-11 ENCOUNTER — Encounter: Payer: Self-pay | Admitting: Physician Assistant

## 2019-07-11 ENCOUNTER — Ambulatory Visit: Payer: Medicare Other | Admitting: Physician Assistant

## 2019-07-11 ENCOUNTER — Ambulatory Visit (INDEPENDENT_AMBULATORY_CARE_PROVIDER_SITE_OTHER): Payer: Medicare Other

## 2019-07-11 DIAGNOSIS — S8261XA Displaced fracture of lateral malleolus of right fibula, initial encounter for closed fracture: Secondary | ICD-10-CM | POA: Diagnosis not present

## 2019-07-11 NOTE — Progress Notes (Signed)
   Office Visit Note   Patient: Christine Conner           Date of Birth: 1929/11/11           MRN: [id number] Visit Date: 07/11/2019              Requested by: Asencion Noble, MD [address] [place],  [place] [address] PCP: Asencion Noble, MD   Assessment & Plan: Visit Diagnoses:  1. Displaced fracture of lateral malleolus of right fibula, initial encounter for closed fracture     Plan: We will place her in an ASO brace weightbearing as tolerated.  She needs to wear the brace whenever she is up ambulating.  See him back in 1 month at that time we will obtain 3 views of her right ankle.  Questions were encouraged and answered at length today.  If she has increasing pain in the ankle she is to go back to the chemotherapy.  Follow-Up Instructions: Return in about 4 weeks (around 08/08/2019) for Radiographs.   Orders:  Orders Placed This Encounter  Procedures  . XR Ankle Complete Right   No orders of the defined types were placed in this encounter.     Procedures: No procedures performed   Clinical Data: No additional findings.   Subjective: Chief Complaint  Patient presents with  . Right Ankle - Follow-up    HPI Christine Conner 85 year old female comes in today for follow-up of her right lateral malleolus fracture.  States overall she is doing well.  She is using a walker and walking in the cam walker boot.  She has no pain no swelling. Review of Systems See HPI  Objective: Vital Signs: There were no vitals taken for this visit.  Physical Exam General well-developed well-nourished female no acute distress Ortho Exam Right ankle good dorsiflexion plantarflexion without pain.  Palpable callus over the lateral malleolus.  Remainder the ankle is nontender.  Calf supple nontender Specialty Comments:  No specialty comments available.  Imaging: Xr Ankle Complete Right  Result Date: 07/11/2019 Right ankle 3 views: Talus well located within the ankle mortise.   Lateral malleolus fracture shows some signs of consolidation.  Fracture site still slightly visible.  No change in overall position or alignment of the lateral malleolus fracture.  No other fractures identified.    PMFS History: Patient Active Problem List   Diagnosis Date Noted  . Pain in right knee 07/16/2016  . Low back pain 07/16/2016   Past Medical History:  Diagnosis Date  . Medical history non-contributory     History reviewed. No pertinent family history.  Past Surgical History:  Procedure Laterality Date  . BREAST CYST EXCISION Right   . CATARACT EXTRACTION W/PHACO Right 05/02/2014   Procedure: CATARACT EXTRACTION PHACO AND INTRAOCULAR LENS PLACEMENT (IOC);  Surgeon: Elta Guadeloupe T. Gershon Crane, MD;  Location: [facility];  Service: Ophthalmology;  Laterality: Right;  CDE:8.79  . CATARACT EXTRACTION W/PHACO Left 05/23/2014   Procedure: CATARACT EXTRACTION PHACO AND INTRAOCULAR LENS PLACEMENT (IOC);  Surgeon: Elta Guadeloupe T. Gershon Crane, MD;  Location: [facility];  Service: Ophthalmology;  Laterality: Left;  CDE 9.01   Social History   Occupational History  . Not on file  Tobacco Use  . Smoking status: Never Smoker  . Smokeless tobacco: Never Used  Substance and Sexual Activity  . Alcohol use: No  . Drug use: No  . Sexual activity: Yes    Birth control/protection: Post-menopausal

## 2019-08-10 ENCOUNTER — Ambulatory Visit: Payer: Medicare HMO | Admitting: Physician Assistant

## 2019-09-27 DIAGNOSIS — E785 Hyperlipidemia, unspecified: Secondary | ICD-10-CM | POA: Diagnosis not present

## 2019-09-27 DIAGNOSIS — R7301 Impaired fasting glucose: Secondary | ICD-10-CM | POA: Diagnosis not present

## 2019-09-27 DIAGNOSIS — M1990 Unspecified osteoarthritis, unspecified site: Secondary | ICD-10-CM | POA: Diagnosis not present

## 2019-09-27 DIAGNOSIS — Z79899 Other long term (current) drug therapy: Secondary | ICD-10-CM | POA: Diagnosis not present

## 2019-10-04 ENCOUNTER — Other Ambulatory Visit (HOSPITAL_COMMUNITY): Payer: Self-pay | Admitting: Internal Medicine

## 2019-10-04 DIAGNOSIS — S82891A Other fracture of right lower leg, initial encounter for closed fracture: Secondary | ICD-10-CM | POA: Diagnosis not present

## 2019-10-04 DIAGNOSIS — M1990 Unspecified osteoarthritis, unspecified site: Secondary | ICD-10-CM | POA: Diagnosis not present

## 2019-10-04 DIAGNOSIS — N951 Menopausal and female climacteric states: Secondary | ICD-10-CM

## 2019-10-04 DIAGNOSIS — N183 Chronic kidney disease, stage 3 unspecified: Secondary | ICD-10-CM | POA: Diagnosis not present

## 2019-10-04 DIAGNOSIS — M48061 Spinal stenosis, lumbar region without neurogenic claudication: Secondary | ICD-10-CM | POA: Diagnosis not present

## 2019-10-13 ENCOUNTER — Ambulatory Visit (HOSPITAL_COMMUNITY)
Admission: RE | Admit: 2019-10-13 | Discharge: 2019-10-13 | Disposition: A | Discharge status: Home / Self Care | Payer: Medicare Other | Source: Ambulatory Visit | Attending: Internal Medicine | Admitting: Internal Medicine

## 2019-10-13 ENCOUNTER — Other Ambulatory Visit: Payer: Self-pay

## 2019-10-13 DIAGNOSIS — M8589 Other specified disorders of bone density and structure, multiple sites: Secondary | ICD-10-CM | POA: Diagnosis not present

## 2019-10-13 DIAGNOSIS — N951 Menopausal and female climacteric states: Secondary | ICD-10-CM

## 2019-10-13 DIAGNOSIS — M85852 Other specified disorders of bone density and structure, left thigh: Secondary | ICD-10-CM | POA: Diagnosis not present

## 2020-05-23 DIAGNOSIS — Z23 Encounter for immunization: Secondary | ICD-10-CM | POA: Diagnosis not present

## 2020-10-03 DIAGNOSIS — N183 Chronic kidney disease, stage 3 unspecified: Secondary | ICD-10-CM | POA: Diagnosis not present

## 2020-10-03 DIAGNOSIS — M1990 Unspecified osteoarthritis, unspecified site: Secondary | ICD-10-CM | POA: Diagnosis not present

## 2020-10-03 DIAGNOSIS — Z79899 Other long term (current) drug therapy: Secondary | ICD-10-CM | POA: Diagnosis not present

## 2020-10-09 DIAGNOSIS — R7301 Impaired fasting glucose: Secondary | ICD-10-CM | POA: Diagnosis not present

## 2020-10-09 DIAGNOSIS — I491 Atrial premature depolarization: Secondary | ICD-10-CM | POA: Diagnosis not present

## 2020-10-09 DIAGNOSIS — N1831 Chronic kidney disease, stage 3a: Secondary | ICD-10-CM | POA: Diagnosis not present

## 2020-10-09 DIAGNOSIS — M19011 Primary osteoarthritis, right shoulder: Secondary | ICD-10-CM | POA: Diagnosis not present

## 2020-12-13 DIAGNOSIS — Z23 Encounter for immunization: Secondary | ICD-10-CM | POA: Diagnosis not present

## 2020-12-26 IMAGING — DX DG ANKLE COMPLETE 3+V*R*
3 series · 3 of 3 positions shown · non-contrast
Comparison: None

CLINICAL DATA: Accidental fall [REDACTED] afternoon twisting her ankle,
pain swelling and bruising laterally

EXAM:
RIGHT ANKLE - COMPLETE 3+ VIEW

[ankle ap]
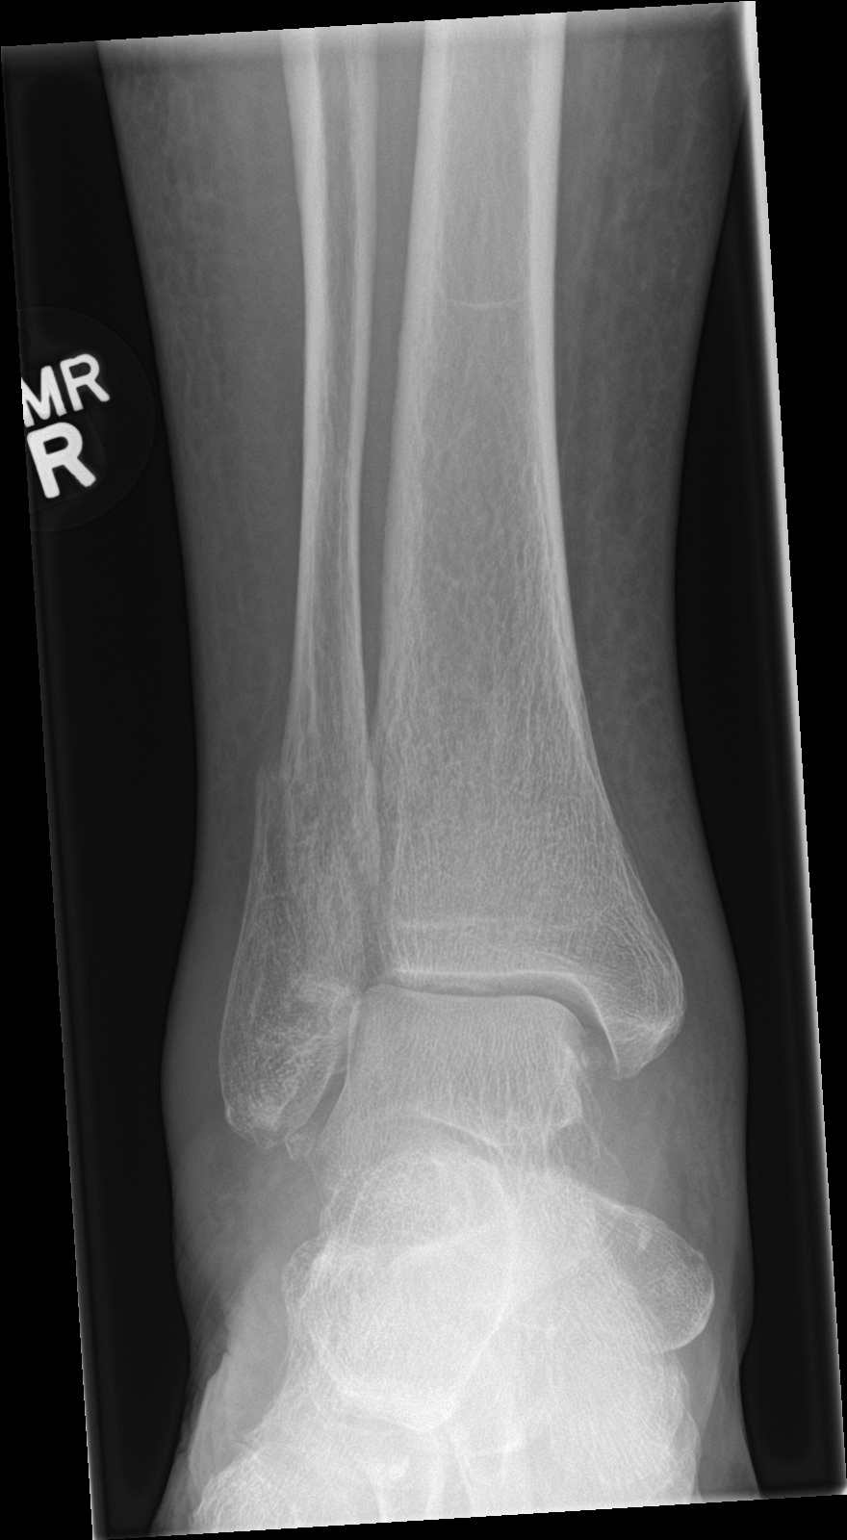

[ankle obl]
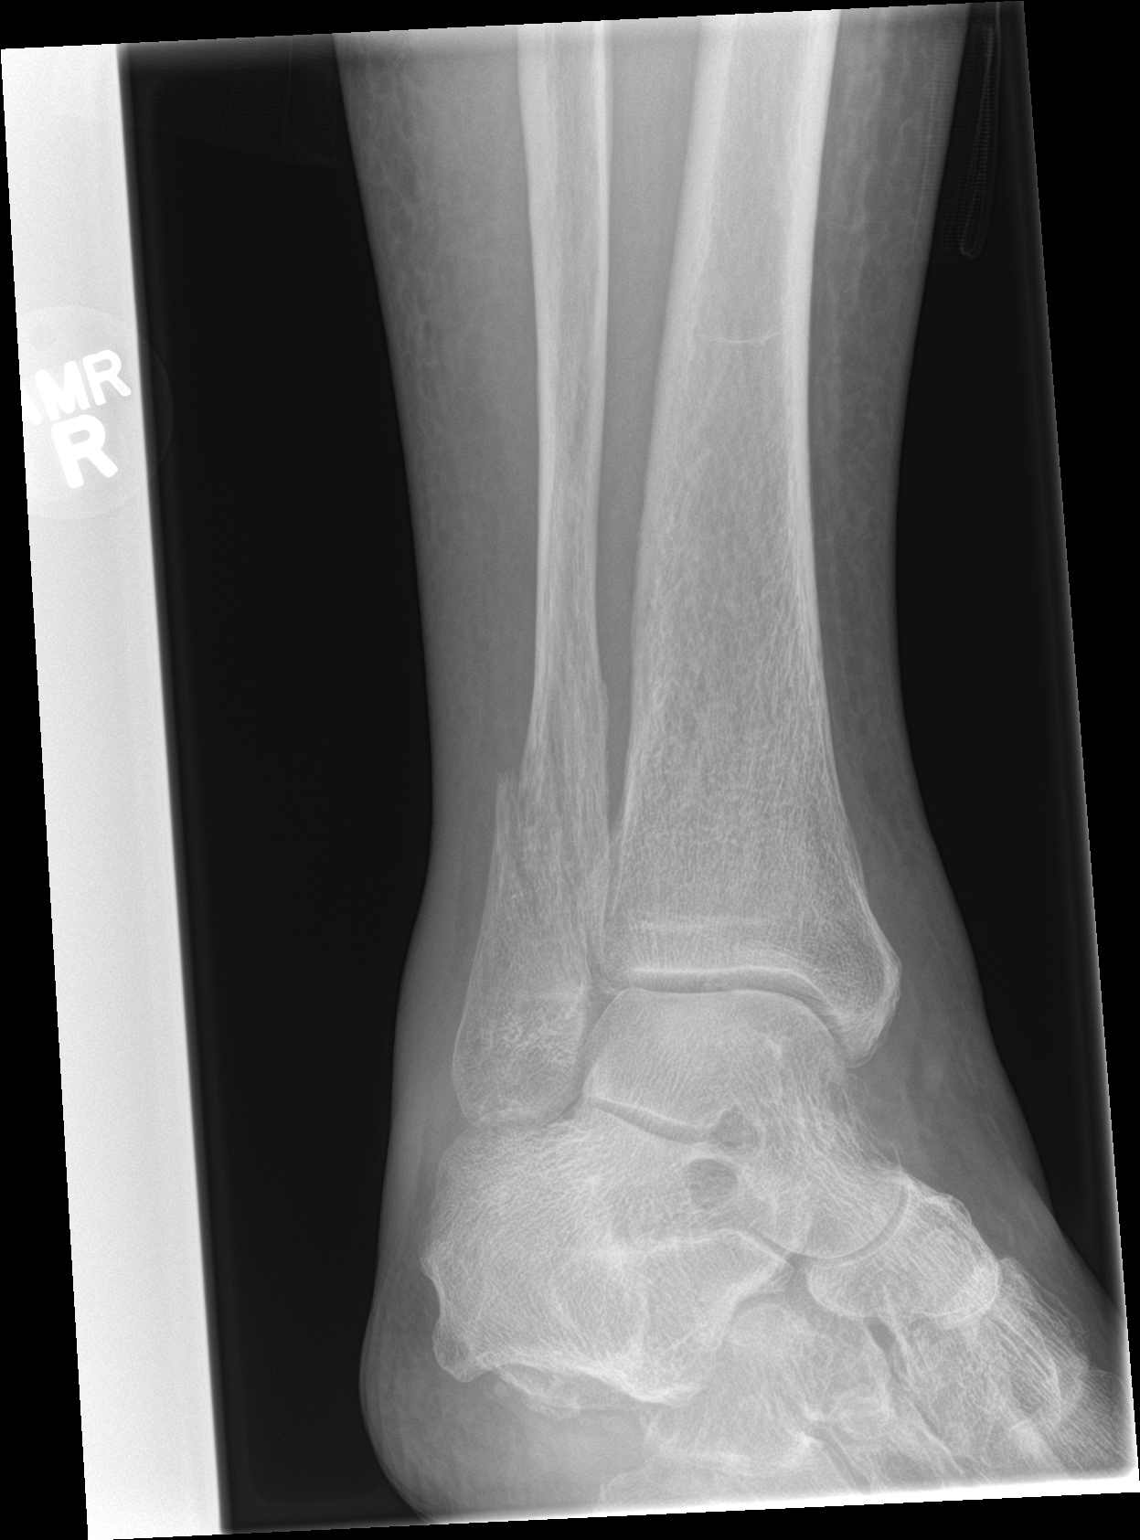

[ankle lat]
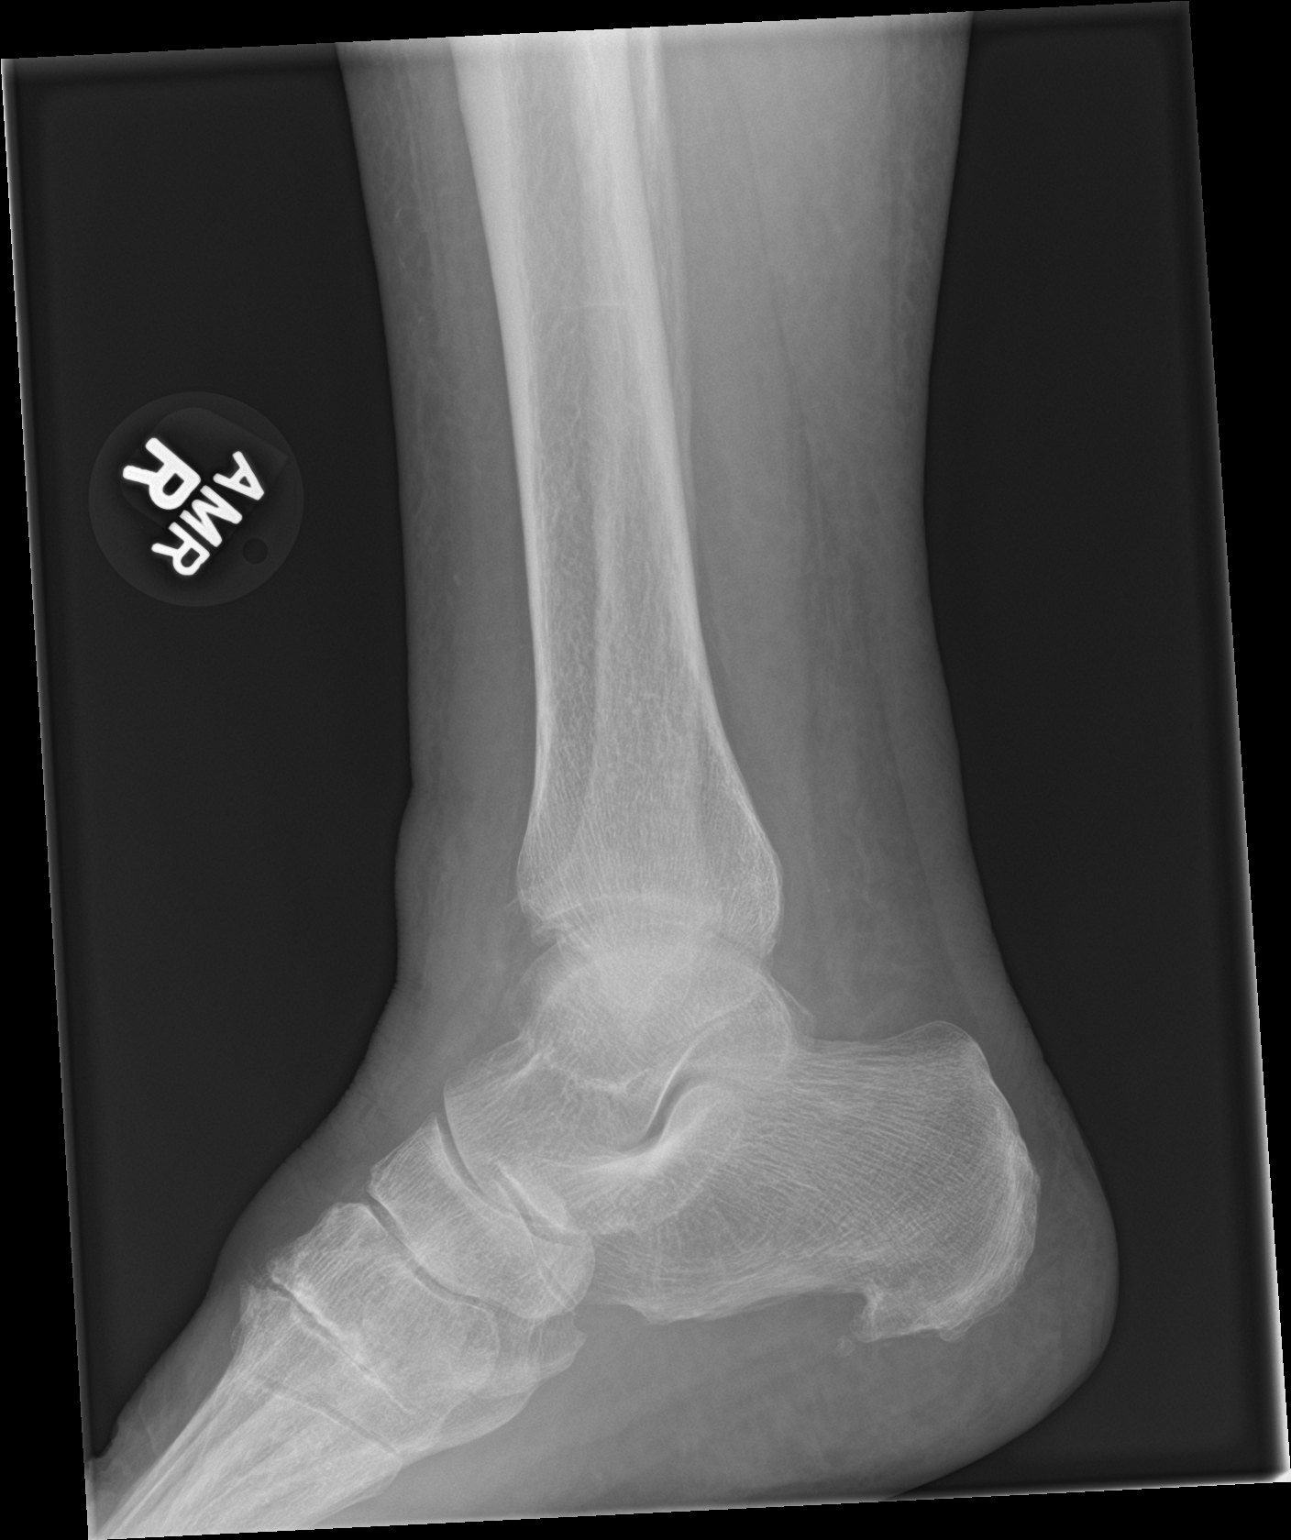

[3 of 3 positions shown; findings below may reference images not displayed]

FINDINGS: Osseous demineralization.

Oblique fracture of the distal RIGHT fibula, slightly displaced
laterally.

Small calcified ossicle at medial joint line.

Additional small ossicle at tip of lateral malleolus, appears old.

Diffuse soft tissue swelling.

Moderate-sized plantar calcaneal spur.

No additional fracture, dislocation or bone destruction.

Degenerative changes at the TMT joints.
IMPRESSION: Displaced oblique fracture of the RIGHT lateral malleolus.

## 2021-04-04 DIAGNOSIS — M7061 Trochanteric bursitis, right hip: Secondary | ICD-10-CM | POA: Diagnosis not present

## 2021-05-17 DIAGNOSIS — Z23 Encounter for immunization: Secondary | ICD-10-CM | POA: Diagnosis not present

## 2021-10-04 DIAGNOSIS — N183 Chronic kidney disease, stage 3 unspecified: Secondary | ICD-10-CM | POA: Diagnosis not present

## 2021-10-04 DIAGNOSIS — M1990 Unspecified osteoarthritis, unspecified site: Secondary | ICD-10-CM | POA: Diagnosis not present

## 2021-10-04 DIAGNOSIS — E785 Hyperlipidemia, unspecified: Secondary | ICD-10-CM | POA: Diagnosis not present

## 2021-10-04 DIAGNOSIS — R7301 Impaired fasting glucose: Secondary | ICD-10-CM | POA: Diagnosis not present

## 2021-10-04 DIAGNOSIS — Z79899 Other long term (current) drug therapy: Secondary | ICD-10-CM | POA: Diagnosis not present

## 2021-10-11 DIAGNOSIS — M48061 Spinal stenosis, lumbar region without neurogenic claudication: Secondary | ICD-10-CM | POA: Diagnosis not present

## 2021-10-11 DIAGNOSIS — N1832 Chronic kidney disease, stage 3b: Secondary | ICD-10-CM | POA: Diagnosis not present

## 2021-10-11 DIAGNOSIS — I4901 Ventricular fibrillation: Secondary | ICD-10-CM | POA: Diagnosis not present

## 2021-10-11 DIAGNOSIS — E785 Hyperlipidemia, unspecified: Secondary | ICD-10-CM | POA: Diagnosis not present

## 2022-01-03 DIAGNOSIS — Z79899 Other long term (current) drug therapy: Secondary | ICD-10-CM | POA: Diagnosis not present

## 2022-01-03 DIAGNOSIS — E785 Hyperlipidemia, unspecified: Secondary | ICD-10-CM | POA: Diagnosis not present

## 2022-01-03 DIAGNOSIS — R7301 Impaired fasting glucose: Secondary | ICD-10-CM | POA: Diagnosis not present

## 2022-01-03 DIAGNOSIS — N1832 Chronic kidney disease, stage 3b: Secondary | ICD-10-CM | POA: Diagnosis not present

## 2022-01-10 DIAGNOSIS — M4800 Spinal stenosis, site unspecified: Secondary | ICD-10-CM | POA: Diagnosis not present

## 2022-01-10 DIAGNOSIS — N1831 Chronic kidney disease, stage 3a: Secondary | ICD-10-CM | POA: Diagnosis not present

## 2022-05-08 DIAGNOSIS — Z79899 Other long term (current) drug therapy: Secondary | ICD-10-CM | POA: Diagnosis not present

## 2022-05-08 DIAGNOSIS — N1831 Chronic kidney disease, stage 3a: Secondary | ICD-10-CM | POA: Diagnosis not present

## 2022-06-02 ENCOUNTER — Other Ambulatory Visit (HOSPITAL_COMMUNITY): Payer: Self-pay | Admitting: Internal Medicine

## 2022-06-02 DIAGNOSIS — R7989 Other specified abnormal findings of blood chemistry: Secondary | ICD-10-CM

## 2022-06-02 DIAGNOSIS — Z23 Encounter for immunization: Secondary | ICD-10-CM | POA: Diagnosis not present

## 2022-06-02 DIAGNOSIS — M48061 Spinal stenosis, lumbar region without neurogenic claudication: Secondary | ICD-10-CM | POA: Diagnosis not present

## 2022-06-02 DIAGNOSIS — N1832 Chronic kidney disease, stage 3b: Secondary | ICD-10-CM | POA: Diagnosis not present

## 2022-06-10 ENCOUNTER — Ambulatory Visit (HOSPITAL_COMMUNITY)
Admission: RE | Admit: 2022-06-10 | Discharge: 2022-06-10 | Disposition: A | Discharge status: Home / Self Care | Payer: Medicare Other | Source: Ambulatory Visit | Attending: Internal Medicine | Admitting: Internal Medicine

## 2022-06-10 DIAGNOSIS — R7989 Other specified abnormal findings of blood chemistry: Secondary | ICD-10-CM | POA: Insufficient documentation

## 2022-06-10 DIAGNOSIS — R944 Abnormal results of kidney function studies: Secondary | ICD-10-CM | POA: Insufficient documentation

## 2022-08-26 ENCOUNTER — Other Ambulatory Visit (HOSPITAL_COMMUNITY): Payer: Self-pay | Admitting: Internal Medicine

## 2022-08-26 DIAGNOSIS — M25562 Pain in left knee: Secondary | ICD-10-CM

## 2022-08-27 ENCOUNTER — Ambulatory Visit (HOSPITAL_COMMUNITY)
Admission: RE | Admit: 2022-08-27 | Discharge: 2022-08-27 | Disposition: A | Discharge status: Home / Self Care | Payer: Medicare Other | Source: Ambulatory Visit | Attending: Internal Medicine | Admitting: Internal Medicine

## 2022-08-27 DIAGNOSIS — M25562 Pain in left knee: Secondary | ICD-10-CM | POA: Insufficient documentation

## 2022-08-29 DIAGNOSIS — M25562 Pain in left knee: Secondary | ICD-10-CM | POA: Diagnosis not present

## 2022-09-29 DIAGNOSIS — M5186 Other intervertebral disc disorders, lumbar region: Secondary | ICD-10-CM | POA: Diagnosis not present

## 2022-09-29 DIAGNOSIS — E785 Hyperlipidemia, unspecified: Secondary | ICD-10-CM | POA: Diagnosis not present

## 2022-09-29 DIAGNOSIS — N1832 Chronic kidney disease, stage 3b: Secondary | ICD-10-CM | POA: Diagnosis not present

## 2022-09-29 DIAGNOSIS — R7301 Impaired fasting glucose: Secondary | ICD-10-CM | POA: Diagnosis not present

## 2022-09-29 DIAGNOSIS — Z79899 Other long term (current) drug therapy: Secondary | ICD-10-CM | POA: Diagnosis not present

## 2022-10-17 DIAGNOSIS — M1712 Unilateral primary osteoarthritis, left knee: Secondary | ICD-10-CM | POA: Diagnosis not present

## 2022-10-27 ENCOUNTER — Other Ambulatory Visit (HOSPITAL_COMMUNITY): Payer: Self-pay | Admitting: Internal Medicine

## 2022-10-27 ENCOUNTER — Ambulatory Visit (HOSPITAL_COMMUNITY)
Admission: RE | Admit: 2022-10-27 | Discharge: 2022-10-27 | Disposition: A | Discharge status: Home / Self Care | Payer: Medicare Other | Source: Ambulatory Visit | Attending: Internal Medicine | Admitting: Internal Medicine

## 2022-10-27 DIAGNOSIS — S3992XA Unspecified injury of lower back, initial encounter: Secondary | ICD-10-CM

## 2022-10-27 DIAGNOSIS — M533 Sacrococcygeal disorders, not elsewhere classified: Secondary | ICD-10-CM | POA: Diagnosis not present

## 2022-10-27 DIAGNOSIS — M545 Low back pain, unspecified: Secondary | ICD-10-CM | POA: Diagnosis not present

## 2022-12-05 DIAGNOSIS — E785 Hyperlipidemia, unspecified: Secondary | ICD-10-CM | POA: Diagnosis not present

## 2022-12-05 DIAGNOSIS — Z79899 Other long term (current) drug therapy: Secondary | ICD-10-CM | POA: Diagnosis not present

## 2022-12-05 DIAGNOSIS — N1832 Chronic kidney disease, stage 3b: Secondary | ICD-10-CM | POA: Diagnosis not present

## 2022-12-12 ENCOUNTER — Other Ambulatory Visit (HOSPITAL_COMMUNITY): Payer: Self-pay | Admitting: Internal Medicine

## 2022-12-12 DIAGNOSIS — Z13820 Encounter for screening for osteoporosis: Secondary | ICD-10-CM

## 2022-12-12 DIAGNOSIS — M810 Age-related osteoporosis without current pathological fracture: Secondary | ICD-10-CM | POA: Diagnosis not present

## 2022-12-12 DIAGNOSIS — N1831 Chronic kidney disease, stage 3a: Secondary | ICD-10-CM | POA: Diagnosis not present

## 2022-12-12 DIAGNOSIS — I491 Atrial premature depolarization: Secondary | ICD-10-CM | POA: Diagnosis not present

## 2022-12-12 DIAGNOSIS — R7309 Other abnormal glucose: Secondary | ICD-10-CM | POA: Diagnosis not present

## 2022-12-24 ENCOUNTER — Ambulatory Visit (HOSPITAL_COMMUNITY)
Admission: RE | Admit: 2022-12-24 | Discharge: 2022-12-24 | Disposition: A | Discharge status: Home / Self Care | Payer: Medicare Other | Source: Ambulatory Visit | Attending: Internal Medicine | Admitting: Internal Medicine

## 2022-12-24 DIAGNOSIS — M8589 Other specified disorders of bone density and structure, multiple sites: Secondary | ICD-10-CM | POA: Insufficient documentation

## 2022-12-24 DIAGNOSIS — Z13820 Encounter for screening for osteoporosis: Secondary | ICD-10-CM | POA: Insufficient documentation

## 2022-12-24 DIAGNOSIS — M810 Age-related osteoporosis without current pathological fracture: Secondary | ICD-10-CM | POA: Diagnosis not present

## 2023-05-18 DIAGNOSIS — Z23 Encounter for immunization: Secondary | ICD-10-CM | POA: Diagnosis not present

## 2023-06-18 DIAGNOSIS — R7301 Impaired fasting glucose: Secondary | ICD-10-CM | POA: Diagnosis not present

## 2023-06-18 DIAGNOSIS — Z79899 Other long term (current) drug therapy: Secondary | ICD-10-CM | POA: Diagnosis not present

## 2023-06-18 DIAGNOSIS — N1831 Chronic kidney disease, stage 3a: Secondary | ICD-10-CM | POA: Diagnosis not present

## 2023-06-25 DIAGNOSIS — N1831 Chronic kidney disease, stage 3a: Secondary | ICD-10-CM | POA: Diagnosis not present

## 2023-06-25 DIAGNOSIS — M810 Age-related osteoporosis without current pathological fracture: Secondary | ICD-10-CM | POA: Diagnosis not present

## 2023-12-10 DIAGNOSIS — Z79899 Other long term (current) drug therapy: Secondary | ICD-10-CM | POA: Diagnosis not present

## 2023-12-10 DIAGNOSIS — R7301 Impaired fasting glucose: Secondary | ICD-10-CM | POA: Diagnosis not present

## 2023-12-10 DIAGNOSIS — I491 Atrial premature depolarization: Secondary | ICD-10-CM | POA: Diagnosis not present

## 2023-12-10 DIAGNOSIS — M5186 Other intervertebral disc disorders, lumbar region: Secondary | ICD-10-CM | POA: Diagnosis not present

## 2023-12-10 DIAGNOSIS — M810 Age-related osteoporosis without current pathological fracture: Secondary | ICD-10-CM | POA: Diagnosis not present

## 2023-12-10 DIAGNOSIS — N1831 Chronic kidney disease, stage 3a: Secondary | ICD-10-CM | POA: Diagnosis not present

## 2023-12-17 DIAGNOSIS — Z0001 Encounter for general adult medical examination with abnormal findings: Secondary | ICD-10-CM | POA: Diagnosis not present

## 2023-12-17 DIAGNOSIS — M810 Age-related osteoporosis without current pathological fracture: Secondary | ICD-10-CM | POA: Diagnosis not present

## 2023-12-17 DIAGNOSIS — N1831 Chronic kidney disease, stage 3a: Secondary | ICD-10-CM | POA: Diagnosis not present

## 2023-12-17 DIAGNOSIS — M48061 Spinal stenosis, lumbar region without neurogenic claudication: Secondary | ICD-10-CM | POA: Diagnosis not present

## 2023-12-18 DIAGNOSIS — Z961 Presence of intraocular lens: Secondary | ICD-10-CM | POA: Diagnosis not present

## 2023-12-18 DIAGNOSIS — H40013 Open angle with borderline findings, low risk, bilateral: Secondary | ICD-10-CM | POA: Diagnosis not present

## 2023-12-18 DIAGNOSIS — H26493 Other secondary cataract, bilateral: Secondary | ICD-10-CM | POA: Diagnosis not present

## 2023-12-18 DIAGNOSIS — H353131 Nonexudative age-related macular degeneration, bilateral, early dry stage: Secondary | ICD-10-CM | POA: Diagnosis not present
